# Patient Record
Sex: Female | Born: 1950 | Race: White | Hispanic: No | Marital: Married | Smoking: Former smoker
Health system: Southern US, Community
[De-identification: ages and names within clinical notes are randomized; demographics above are authoritative.]

## PROBLEM LIST (undated history)

## (undated) DIAGNOSIS — L409 Psoriasis, unspecified: Secondary | ICD-10-CM

## (undated) DIAGNOSIS — K219 Gastro-esophageal reflux disease without esophagitis: Secondary | ICD-10-CM

## (undated) DIAGNOSIS — E119 Type 2 diabetes mellitus without complications: Secondary | ICD-10-CM

## (undated) DIAGNOSIS — Z8669 Personal history of other diseases of the nervous system and sense organs: Secondary | ICD-10-CM

## (undated) DIAGNOSIS — I10 Essential (primary) hypertension: Secondary | ICD-10-CM

## (undated) DIAGNOSIS — C801 Malignant (primary) neoplasm, unspecified: Secondary | ICD-10-CM

## (undated) DIAGNOSIS — E042 Nontoxic multinodular goiter: Secondary | ICD-10-CM

## (undated) DIAGNOSIS — M1990 Unspecified osteoarthritis, unspecified site: Secondary | ICD-10-CM

## (undated) DIAGNOSIS — E7800 Pure hypercholesterolemia, unspecified: Secondary | ICD-10-CM

## (undated) DIAGNOSIS — G4730 Sleep apnea, unspecified: Secondary | ICD-10-CM

## (undated) DIAGNOSIS — J45909 Unspecified asthma, uncomplicated: Secondary | ICD-10-CM

## (undated) DIAGNOSIS — M4800 Spinal stenosis, site unspecified: Secondary | ICD-10-CM

## (undated) DIAGNOSIS — D649 Anemia, unspecified: Secondary | ICD-10-CM

## (undated) DIAGNOSIS — E785 Hyperlipidemia, unspecified: Secondary | ICD-10-CM

## (undated) HISTORY — PX: TOTAL HIP ARTHROPLASTY: SHX124

## (undated) HISTORY — PX: CHOLECYSTECTOMY: SHX55

## (undated) HISTORY — PX: JOINT REPLACEMENT: SHX530

## (undated) HISTORY — PX: CARPAL TUNNEL RELEASE: SHX101

---

## 1981-10-05 HISTORY — PX: CHOLECYSTECTOMY: SHX55

## 1997-10-05 DIAGNOSIS — C801 Malignant (primary) neoplasm, unspecified: Secondary | ICD-10-CM

## 1997-10-05 HISTORY — DX: Malignant (primary) neoplasm, unspecified: C80.1

## 2004-08-29 ENCOUNTER — Other Ambulatory Visit: Payer: Self-pay

## 2004-09-10 ENCOUNTER — Ambulatory Visit: Payer: Self-pay | Admitting: General Practice

## 2004-11-19 ENCOUNTER — Ambulatory Visit: Payer: Self-pay | Admitting: Family Medicine

## 2005-03-17 ENCOUNTER — Ambulatory Visit: Payer: Self-pay | Admitting: General Practice

## 2005-12-22 ENCOUNTER — Ambulatory Visit: Payer: Self-pay | Admitting: Family Medicine

## 2006-03-29 ENCOUNTER — Ambulatory Visit: Payer: Self-pay | Admitting: General Practice

## 2007-04-21 ENCOUNTER — Ambulatory Visit: Payer: Self-pay | Admitting: General Practice

## 2007-05-26 ENCOUNTER — Ambulatory Visit: Payer: Self-pay | Admitting: Anesthesiology

## 2007-06-29 ENCOUNTER — Ambulatory Visit: Payer: Self-pay | Admitting: Anesthesiology

## 2007-07-05 ENCOUNTER — Ambulatory Visit: Payer: Self-pay | Admitting: Family Medicine

## 2007-08-18 ENCOUNTER — Ambulatory Visit: Payer: Self-pay | Admitting: Anesthesiology

## 2007-09-21 ENCOUNTER — Ambulatory Visit: Payer: Self-pay | Admitting: Anesthesiology

## 2007-11-03 ENCOUNTER — Ambulatory Visit: Payer: Self-pay | Admitting: Anesthesiology

## 2007-11-14 ENCOUNTER — Ambulatory Visit: Payer: Self-pay | Admitting: Anesthesiology

## 2008-01-02 ENCOUNTER — Ambulatory Visit: Payer: Self-pay | Admitting: Anesthesiology

## 2008-07-02 ENCOUNTER — Ambulatory Visit: Payer: Self-pay | Admitting: Gastroenterology

## 2008-09-25 ENCOUNTER — Ambulatory Visit: Payer: Self-pay | Admitting: Family Medicine

## 2009-10-28 ENCOUNTER — Emergency Department: Payer: Self-pay | Admitting: Emergency Medicine

## 2009-11-22 ENCOUNTER — Ambulatory Visit: Payer: Self-pay | Admitting: Family Medicine

## 2010-11-27 ENCOUNTER — Ambulatory Visit: Payer: Self-pay | Admitting: Family Medicine

## 2012-01-07 ENCOUNTER — Ambulatory Visit: Payer: Self-pay | Admitting: Family Medicine

## 2012-01-11 ENCOUNTER — Ambulatory Visit: Payer: Self-pay | Admitting: General Practice

## 2013-02-08 ENCOUNTER — Ambulatory Visit: Payer: Self-pay | Admitting: Family Medicine

## 2013-04-17 ENCOUNTER — Ambulatory Visit: Payer: Self-pay | Admitting: General Practice

## 2013-04-17 LAB — CBC
HCT: 37 % (ref 35.0–47.0)
HGB: 11.4 g/dL — ABNORMAL LOW (ref 12.0–16.0)
MCHC: 30.8 g/dL — ABNORMAL LOW (ref 32.0–36.0)
MCV: 77 fL — ABNORMAL LOW (ref 80–100)
RBC: 4.78 10*6/uL (ref 3.80–5.20)
RDW: 17 % — ABNORMAL HIGH (ref 11.5–14.5)

## 2013-04-17 LAB — URINALYSIS, COMPLETE
Bilirubin,UR: NEGATIVE
Blood: NEGATIVE
Glucose,UR: NEGATIVE mg/dL (ref 0–75)
Ketone: NEGATIVE
Protein: NEGATIVE
Specific Gravity: 1.006 (ref 1.003–1.030)
Squamous Epithelial: 2
WBC UR: 2 /HPF (ref 0–5)

## 2013-04-17 LAB — BASIC METABOLIC PANEL
Anion Gap: 5 — ABNORMAL LOW (ref 7–16)
Calcium, Total: 9.5 mg/dL (ref 8.5–10.1)
Creatinine: 0.84 mg/dL (ref 0.60–1.30)
EGFR (African American): >60
Glucose: 97 mg/dL (ref 65–99)
Osmolality: 282 (ref 275–301)
Potassium: 3.7 mmol/L (ref 3.5–5.1)

## 2013-04-17 LAB — PROTIME-INR: INR: 0.9

## 2013-04-17 LAB — APTT: Activated PTT: 26.3 secs (ref 23.6–35.9)

## 2013-05-03 ENCOUNTER — Inpatient Hospital Stay: Payer: Self-pay | Admitting: General Practice

## 2013-05-03 LAB — HEMOGLOBIN: HGB: 11 g/dL — ABNORMAL LOW (ref 12.0–16.0)

## 2013-05-04 LAB — BASIC METABOLIC PANEL
Anion Gap: 4 — ABNORMAL LOW (ref 7–16)
Calcium, Total: 8.5 mg/dL (ref 8.5–10.1)
Chloride: 105 mmol/L (ref 98–107)
Creatinine: 0.97 mg/dL (ref 0.60–1.30)
EGFR (Non-African Amer.): >60
Glucose: 132 mg/dL — ABNORMAL HIGH (ref 65–99)
Potassium: 3.8 mmol/L (ref 3.5–5.1)
Sodium: 138 mmol/L (ref 136–145)

## 2013-05-04 LAB — HEMOGLOBIN: HGB: 8.6 g/dL — ABNORMAL LOW (ref 12.0–16.0)

## 2013-05-04 LAB — PLATELET COUNT: Platelet: 267 10*3/uL (ref 150–440)

## 2013-05-05 LAB — BASIC METABOLIC PANEL
BUN: 7 mg/dL (ref 7–18)
Calcium, Total: 8.7 mg/dL (ref 8.5–10.1)
Co2: 31 mmol/L (ref 21–32)
Potassium: 3.1 mmol/L — ABNORMAL LOW (ref 3.5–5.1)
Sodium: 137 mmol/L (ref 136–145)

## 2013-05-05 LAB — PLATELET COUNT: Platelet: 252 10*3/uL (ref 150–440)

## 2013-05-05 LAB — HEMOGLOBIN: HGB: 7.9 g/dL — ABNORMAL LOW (ref 12.0–16.0)

## 2013-05-06 LAB — BASIC METABOLIC PANEL
Calcium, Total: 8.8 mg/dL (ref 8.5–10.1)
Chloride: 98 mmol/L (ref 98–107)
Co2: 35 mmol/L — ABNORMAL HIGH (ref 21–32)
Creatinine: 0.86 mg/dL (ref 0.60–1.30)
EGFR (Non-African Amer.): >60
Glucose: 128 mg/dL — ABNORMAL HIGH (ref 65–99)
Osmolality: 277 (ref 275–301)
Potassium: 3.2 mmol/L — ABNORMAL LOW (ref 3.5–5.1)

## 2013-05-06 LAB — HEMOGLOBIN: HGB: 8.1 g/dL — ABNORMAL LOW (ref 12.0–16.0)

## 2013-06-19 ENCOUNTER — Ambulatory Visit: Payer: Self-pay | Admitting: Specialist

## 2013-09-20 ENCOUNTER — Ambulatory Visit: Payer: Self-pay | Admitting: Specialist

## 2014-02-09 ENCOUNTER — Ambulatory Visit: Payer: Self-pay | Admitting: Family Medicine

## 2014-03-22 ENCOUNTER — Ambulatory Visit: Payer: Self-pay | Admitting: Specialist

## 2014-03-29 DIAGNOSIS — G4733 Obstructive sleep apnea (adult) (pediatric): Secondary | ICD-10-CM | POA: Insufficient documentation

## 2014-03-29 DIAGNOSIS — R918 Other nonspecific abnormal finding of lung field: Secondary | ICD-10-CM | POA: Insufficient documentation

## 2014-03-29 DIAGNOSIS — E041 Nontoxic single thyroid nodule: Secondary | ICD-10-CM | POA: Insufficient documentation

## 2014-05-16 DIAGNOSIS — E119 Type 2 diabetes mellitus without complications: Secondary | ICD-10-CM | POA: Insufficient documentation

## 2014-05-16 DIAGNOSIS — I10 Essential (primary) hypertension: Secondary | ICD-10-CM | POA: Insufficient documentation

## 2014-05-16 DIAGNOSIS — J45909 Unspecified asthma, uncomplicated: Secondary | ICD-10-CM | POA: Insufficient documentation

## 2014-12-22 IMAGING — CT CT CHEST W/O CM
1 series · 15 of 33 positions shown, 19 images · non-contrast
Comparison: none

REASON FOR EXAM: rt lower lobe density
COMMENTS:

[Series 2: soft tissue · axial · 0.66mm/px · z∈[-384,-150]mm · 15 of 92 slices shown, 19 images]
[im 7/92  mediastinal]
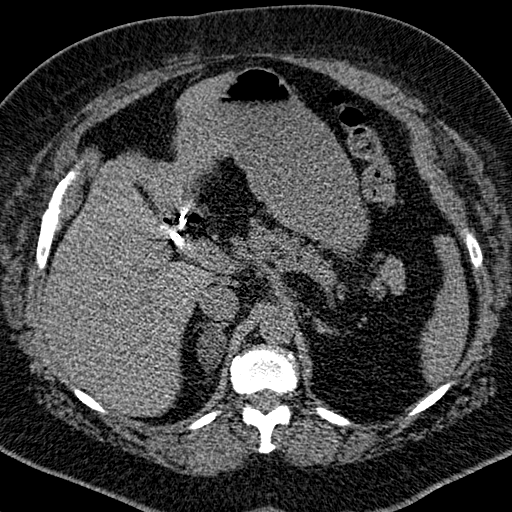
[im 7/92  lung]
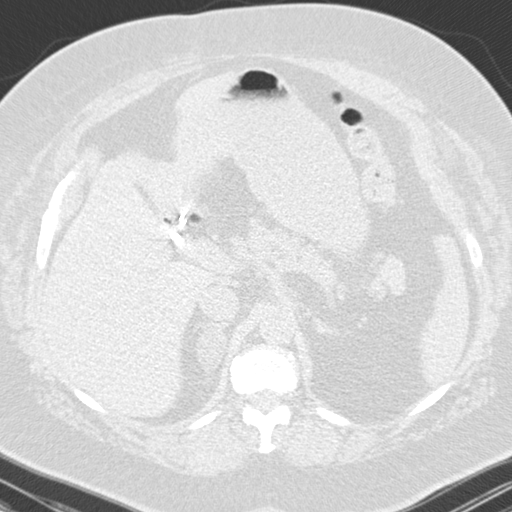
[im 14/92  lung]
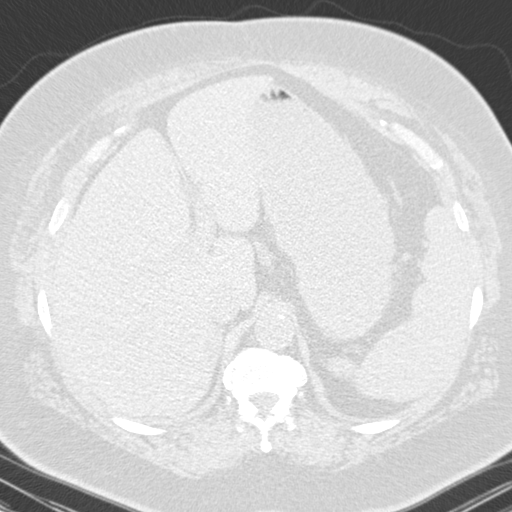
[im 19/92  lung]
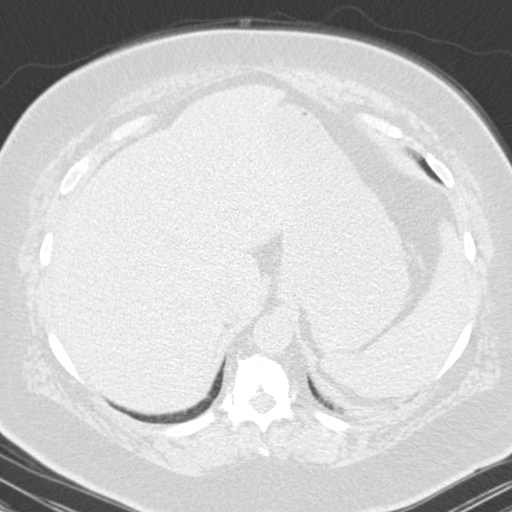
[im 24/92  lung]
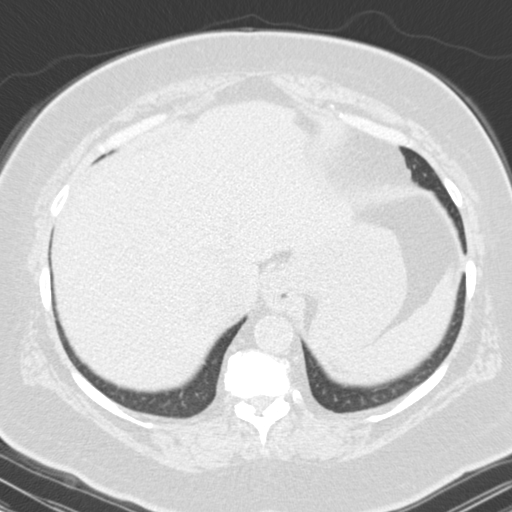
[im 31/92  mediastinal]
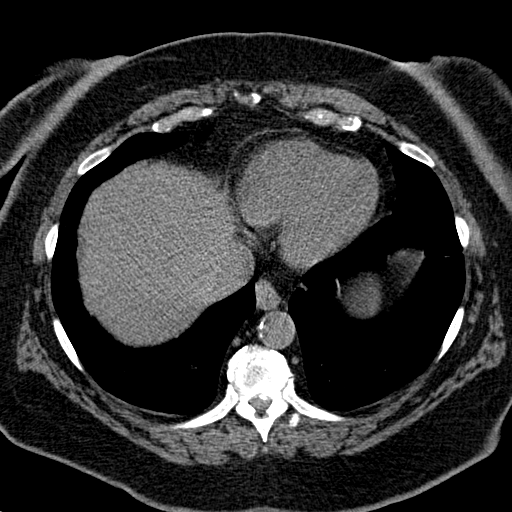
[im 31/92  lung]
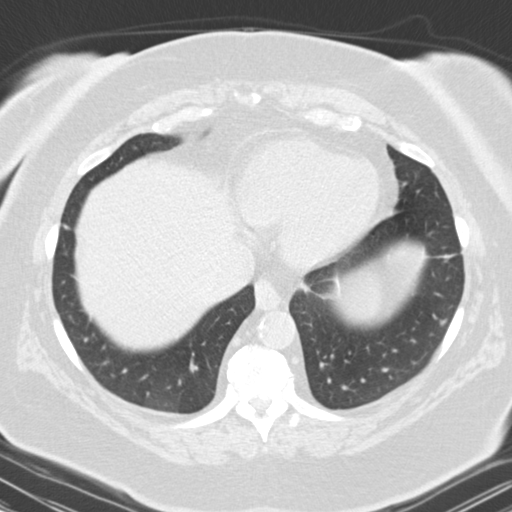
[im 37/92  lung]
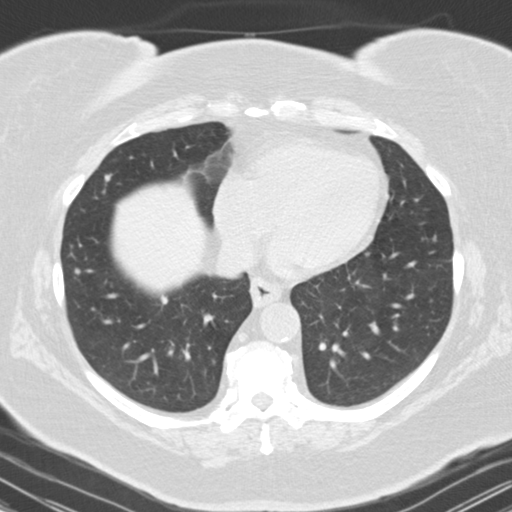
[im 41/92  lung]
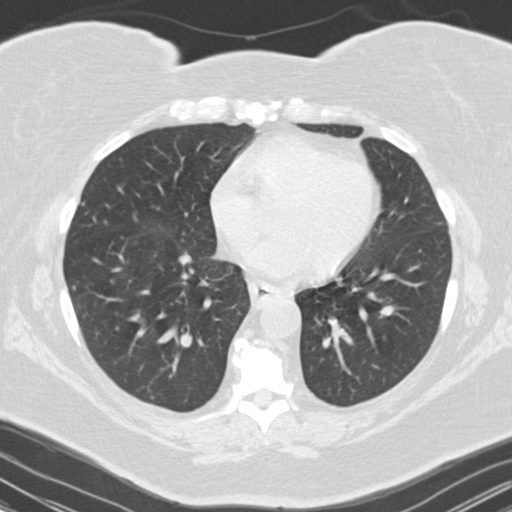
[im 48/92  lung]
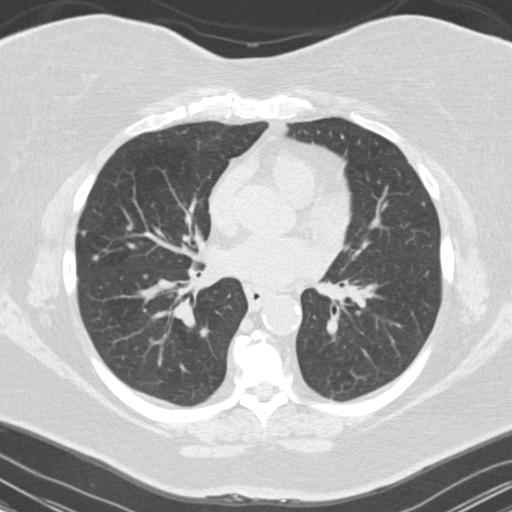
[im 51/92  mediastinal]
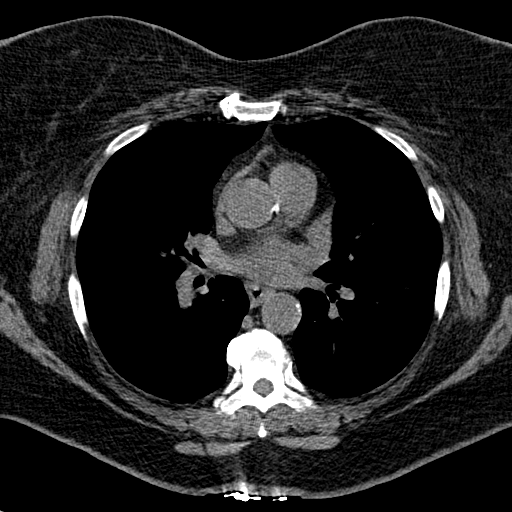
[im 51/92  lung]
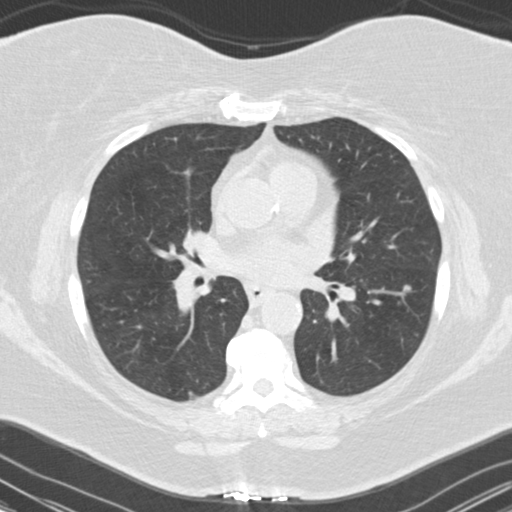
[im 55/92  lung]
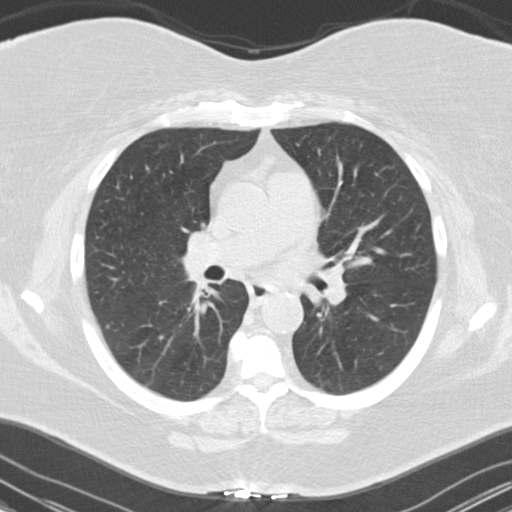
[im 61/92  lung]
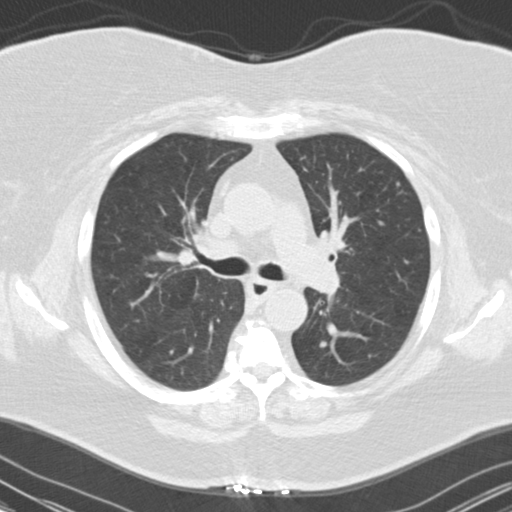
[im 68/92  lung]
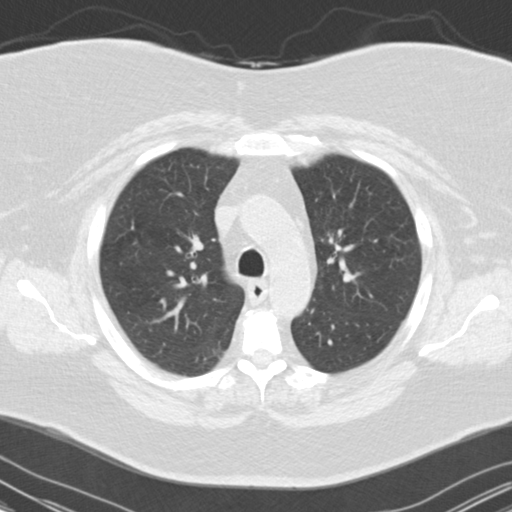
[im 73/92  mediastinal]
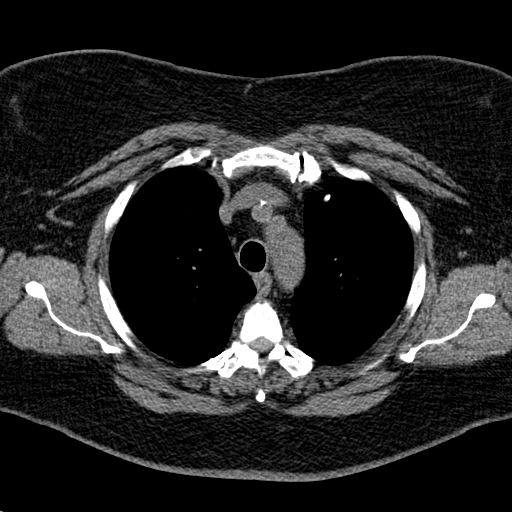
[im 73/92  lung]
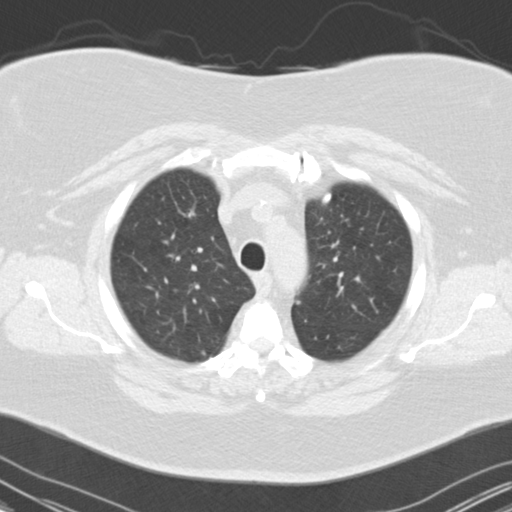
[im 78/92  lung]
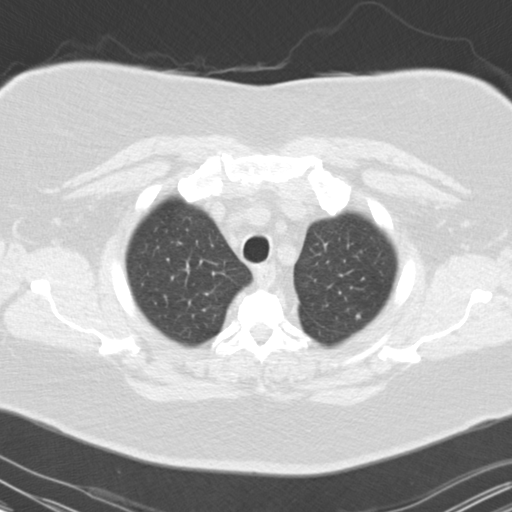
[im 85/92  lung]
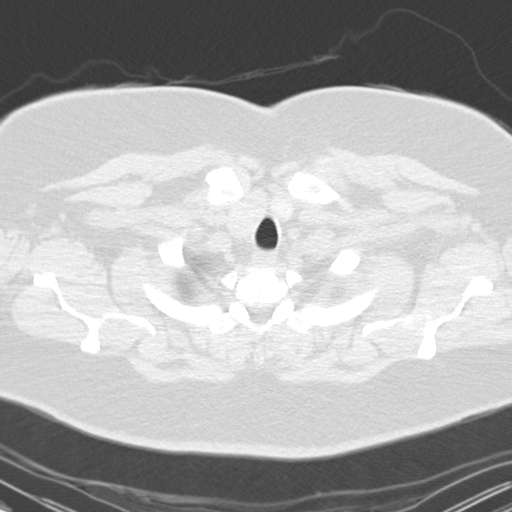

[15 of 33 positions shown; findings below may reference images not displayed]

PROCEDURE:     VELAZQUES - VELAZQUES CHEST WITHOUT CONTRAST  - June 19, 2013 [DATE]

RESULT:     Noncontrast CT of the chest is performed. The patient has no
previous similar study for comparison.

There is minimal nodularity in the left upper lobe suggestive of apical
fibrosis on images 10 and 11. There is a smoothly marginated noncalcified
nodular density on image 15 measuring approximately 2 mm diameter. There is
another nodular density measuring 3 mm on image 18 in the left upper lobe
without calcification. Lung window images show a multitude of additional 1
to 3 mm smoothly marginated nodular densities in both lungs without
calcification. These are nonspecific and certainly the possibility of
granulomatous disease or fibrosis chronic interstitial lung disease could be
considered. Some of the larger lesions may measure up is much as 4 mm to 5
mm. Nodule detection software suggests greater than 90 nodular densities
scattered throughout the lungs. There is linear subsegmental atelectasis or
fibrosis in both lower lobes. There is no effusion, edema or pneumothorax.
The possibility of metastatic disease is not excluded, especially if the
patient has a known primary malignancy. The bony structures appear to be
grossly normal. Heart is nonenlarged. There is no pleural or pericardial
effusion or pneumothorax. Cholecystectomy clips are present.
IMPRESSION: 1. Numerous nodular densities throughout both lungs some measuring up to 5
mm but most being in the 2 to 4 mm size range. Followup in 3 months is
recommended. If the patient has a known malignancy then the possibility of
widespread metastatic disease should be considered.

[REDACTED]

## 2015-01-18 ENCOUNTER — Other Ambulatory Visit: Payer: Self-pay | Admitting: Specialist

## 2015-01-18 DIAGNOSIS — J984 Other disorders of lung: Secondary | ICD-10-CM

## 2015-01-25 NOTE — Discharge Summary (Signed)
PATIENT NAME:  Amanda Watson, SPEICH MR#:  [id number] DATE OF BIRTH:  [birth year]  DATE OF ADMISSION:  05/03/2013 DATE OF DISCHARGE:    ADMITTING DIAGNOSIS: Degenerative arthrosis of the right hip.   DISCHARGE DIAGNOSIS:  DIAGNOSIS: Degenerative arthrosis of the right hip.  OPERATION: On 05/03/2013, the patient had a right total hip arthroplasty.   SURGEON:  Dr. Marry Guan.   ASSISTANT: April Berndt, NP.   ANESTHESIA: Spinal and general.  ESTIMATED BLOOD LOSS: 1200 mL.   DRAINS: Two medium drains to Hemovac reservoir.   IMPLANTS USED: DePuy 10.5 mm small stature AML femoral component, 50 mm outer diameter Pinnacle Gription sector acetabular component, 6.5 mm x 30 mm cancellus bone screw, +4 mm 10 degrees Pinnacle Marathon polyethylene liner and a 32 mm cobalt chrome hip ball with +1 mm neck length.   The patient was stabilized, brought to the recovery room, and then brought down to the orthopedic floor.   HISTORY AND PHYSICAL: The patient is a 64 year old female who presented for an upcoming total hip replacement. The patient has had increasing pain involving both hips with weight-bearing and the left seems to be more symptomatic. The patient and difficulty with activities of daily living and has been refractory to conservative treatment.   PHYSICAL EXAMINATION: GENERAL: Well-developed, well-nourished female with antalgic gait using a cane.  LUNGS: Clear to auscultation.  CARDIOVASCULAR:  Regular rate and rhythm. MUSCULOSKELETAL: With regard to the left hip, the patient has full extension at 100 degrees flexion with pain with motion. The patient has internal and external rotation of 10 degrees to 20 degrees. The patient has negative straight leg raise.   HOSPITAL COURSE: After initial admission on May 03, 2013, the patient was brought to the orthopedic floor. On postoperative day 1, the patient had a hemoglobin of 8.6. On postoperative day 2, it was down to 7.9 and the patient's potassium went  from 3.8 on postoperative day 1 to 3.1 on postoperative day 2. The patient was given potassium orally. The patient was also given no specific transfusion initially based on the fact that her vitals were stable and she was ambulating well with physical therapy with no complaints of dizziness. The patient did have nausea on postoperative day 2 and was given some IV nausea medicine and did well with this. The patient was ready to go home on 05/05/2013, with home health physical therapy.   CONDITION AT DISCHARGE: Stable.   DISPOSITION: The patient was sent home with home health physical therapy.   DISCHARGE INSTRUCTIONS:  The patient will follow up at [facility] in about 2 weeks with Dr. Vance Peper for staple removal. The patient will work this with physical therapy at home doing gait training and range of motion activities and strengthening. The patient will use posterior hip precautions. The patient is weight-bear as tolerated. The patient will use thigh-high TED hose on both legs, removed at bedtime. The patient will elevate her heels off the bed. The patient will be encouraged to do cough and deep breathing. The patient's diet is regular. The patient will keep her dressing clean and dry and try not to get it wet. The patient will call the clinic if there is any bright red bleeding, calf pain, bowel or bladder difficulty, or fever greater than 101.5.   DISCHARGE MEDICATIONS:  Resume home medications, and then to add oxycodone 5 mg 1 tablet q. 4 hours as needed for pain and Lovenox 40 mg subcutaneous once a day for 24 days and  discontinue.    ____________________________ Lenna Sciara. Reche Dixon, [place] [email or web address] D: 05/05/2013 07:32:02 ET T: 05/05/2013 07:54:44 ET JOB#: [id number]  cc: J. Reche Dixon, [place], <Dictator> J Charvis Lightner [facility] PA ELECTRONICALLY SIGNED 05/29/2013 10:35

## 2015-01-25 NOTE — Op Note (Signed)
PATIENT NAME:  Amanda Watson, Amanda Watson MR#:  [id number] DATE OF BIRTH:  07-18-1951  DATE OF PROCEDURE:  05/03/2013  PREOPERATIVE DIAGNOSIS: Degenerative arthrosis of the left hip.   POSTOPERATIVE DIAGNOSIS: Degenerative arthrosis of the left hip.   PROCEDURE PERFORMED: Left total hip arthroplasty.   SURGEON: Laurice Record. Holley Bouche., MD   ASSISTANT: April Berndt, NP (required to maintain retraction throughout the procedure)   ANESTHESIA: Spinal and general.   ESTIMATED BLOOD LOSS: 1200 mL.   IV FLUIDS: 3300 mL of crystalloid.   DRAINS: Two medium drains to Hemovac reservoir.   IMPLANTS UTILIZED: DePuy 10.5 mm small stature AML femoral component, a 50 mm outer diameter Pinnacle Gription Sector acetabular component, 6.5 mm x 30 mm cancellous bone screw, +4 mm 10-degree Pinnacle Marathon polyethylene liner and a 32 mm cobalt chrome hip ball with a +1 mm neck length.   INDICATIONS FOR SURGERY: The patient is a 64 year old female who has been seen for complaints of progressive left hip and groin pain. X-rays demonstrated severe degenerative changes. After discussion of the risks and benefits of surgical intervention, the patient expressed understanding of the risks and benefits and agreed with plans for surgical intervention.   PROCEDURE IN DETAIL: The patient was brought into the Operating Room, and after adequate spinal anesthesia was achieved the patient was placed in a right lateral decubitus position. An axillary roll was placed, and all bony prominences were well padded. The patient's left hip and leg were cleaned and prepped with alcohol and DuraPrep and draped in the usual sterile fashion. A "timeout" was performed as per usual protocol. A lateral curvilinear incision was made gently curving towards the posterior superior iliac spine. The IT band was incised in line with the skin incisions. Fibers of the gluteus maximus were split in line. The piriformis tendon was identified, skeletonized, and incised  at its insertion and the proximal femur reflected posteriorly. In a similar fashion, short external rotators were incised and reflected posteriorly. A T-type posterior capsulotomy was performed. Prior to dislocation of the femoral head, a threaded Steinmann pin was inserted through a separate stab incision into the pelvis superior to the acetabulum and then bent in the form of a stylus so as to assess limb length and hip offset throughout the procedure. The femoral head was then dislocated posteriorly. The gluteal sling was released so as to better mobilize the proximal femur. Inspection of the femoral head demonstrated severe degenerative changes. A femoral neck cut was performed using an oscillating saw. The anterior capsule was elevated off of the femoral neck. Inspection of the acetabulum demonstrated significant degenerative changes. A remnant of the labrum was excised. The acetabulum was reamed in a sequential fashion up to a 49 mm diameter. Good punctate bleeding bone was encountered. A 50 mm outer diameter Pinnacle 100 acetabular component was initially impacted into place with what was felt to be good fit. Trial polyethylene was inserted, and attention was directed to the proximal femur. A pilot hole for reaming of the proximal femoral canal was prepared using a high-speed bur. The proximal femoral canal was reamed in a sequential fashion up to a 10 mm diameter. This allowed for almost 7 degrees of scratch fit. The proximal femur was then prepared using a 10.5 mm aggressive side-biting reamer. A 10.5 mm small stature broach was inserted and impacted into place. The calcar region was planed accordingly, and trial reduction was performed with a 32 mm hip ball with a +1 mm neck length. Good equalization  of limb lengths was appreciated, and good hip offset was appreciated. The trial components were removed. A +4 mm 10-degree Pinnacle Marathon polyethylene liner was positioned and impacted into place. Next, a  10.5 mm small stature AML was initially positioned. Over 7 cm of scratch fit would be achieved. It was thus elected to remove the stem and ream line-to-line using a 10.5 mm reamer. The AML femoral component was again positioned and impacted into place. Excellent scratch fit was appreciated. Trial reduction was performed using the 32 mm hip ball with a +1 mm neck length. Excellent stability was noted anteriorly.  With trialing of the posterior stability, there was noted to be some movement of the cup. The femur was positioned so as to better visualize the acetabular component. There was indeed gross motion, and it was felt that soft tissue had apparently become trapped behind the acetabular component, thus not allowing for a secure fit. The Pinnacle 100 acetabular component was removed. Additional tension was made at debriding additional labrum as well as soft tissue along the anterior and inferior aspect of the acetabulum. The acetabulum was inspected and a rim felt to be intact. A 50 mm Pinnacle Gription Sector acetabular component was then carefully positioned, making sure to prevent any soft tissue from becoming trapped between the component and the bony acetabulum. The acetabular component was positioned and impacted into place. Excellent scratch fit was appreciated. It was elected to provide additional security by adding a 6.5 x 30 mm cancellous screw in the superior aspect of the cup. Again, excellent stability of the cup was appreciated. A new +4 mm 10-degree Pinnacle Marathon polyethylene implant was positioned with the high side at approximately 4:00 and then impacted into place. A secure fit of the liner was noted. A 32 mm cobalt chrome hip ball with a +1 mm neck length was placed on the cleaned trunnion and impacted into place. The hip was reduced and again placed through a range of motion. Excellent stability was noted both anteriorly and posteriorly. Good equalization of limb lengths and restoration of  hip offset was appreciated.   The wound was irrigated with copious amounts of normal saline with antibiotic solution using pulsatile lavage and then suctioned dry. Good hemostasis was noted. The posterior capsulotomy was repaired using #5 Ethibond. The piriformis tendon was reapproximated on the undersurface of the gluteus medius tendon using #5 Ethibond. The gluteal sling was repaired using #5 Ethibond. Two medium drains were placed within the wound bed and brought out through a separate stab incision to be attached to a Hemovac reservoir. IT band was repaired using interrupted sutures of #1 Vicryl. The subcutaneous tissue was approximated in layers using first #0 Vicryl followed by #2-0 Vicryl. Skin was closed with skin staples. A sterile dressing was applied.   The patient tolerated the procedure well. She was transported to the recovery room in stable condition.   ____________________________ Laurice Record. Holley Bouche., MD [email or web address] D: 05/03/2013 19:31:28 ET T: 05/03/2013 20:16:28 ET JOB#: [id number]  cc: Laurice Record. Holley Bouche., MD, <Dictator> JAMES P Holley Bouche MD ELECTRONICALLY SIGNED 05/05/2013 18:08

## 2015-01-29 ENCOUNTER — Other Ambulatory Visit: Payer: Self-pay | Admitting: Family Medicine

## 2015-01-29 DIAGNOSIS — Z1231 Encounter for screening mammogram for malignant neoplasm of breast: Secondary | ICD-10-CM

## 2015-02-12 ENCOUNTER — Ambulatory Visit
Admission: RE | Admit: 2015-02-12 | Discharge: 2015-02-12 | Disposition: A | Discharge status: Home / Self Care | Payer: Medicare Other | Source: Ambulatory Visit | Attending: Family Medicine | Admitting: Family Medicine

## 2015-02-12 DIAGNOSIS — Z1231 Encounter for screening mammogram for malignant neoplasm of breast: Secondary | ICD-10-CM | POA: Diagnosis not present

## 2015-02-12 HISTORY — DX: Malignant (primary) neoplasm, unspecified: C80.1

## 2015-03-15 ENCOUNTER — Ambulatory Visit
Admission: RE | Admit: 2015-03-15 | Discharge: 2015-03-15 | Disposition: A | Discharge status: Home / Self Care | Payer: Medicare Other | Source: Ambulatory Visit | Attending: Specialist | Admitting: Specialist

## 2015-03-15 DIAGNOSIS — E278 Other specified disorders of adrenal gland: Secondary | ICD-10-CM | POA: Insufficient documentation

## 2015-03-15 DIAGNOSIS — Z09 Encounter for follow-up examination after completed treatment for conditions other than malignant neoplasm: Secondary | ICD-10-CM | POA: Diagnosis present

## 2015-03-15 DIAGNOSIS — R911 Solitary pulmonary nodule: Secondary | ICD-10-CM | POA: Diagnosis present

## 2015-03-15 DIAGNOSIS — J984 Other disorders of lung: Secondary | ICD-10-CM

## 2015-03-25 ENCOUNTER — Other Ambulatory Visit: Payer: Self-pay | Admitting: Specialist

## 2015-03-25 DIAGNOSIS — R918 Other nonspecific abnormal finding of lung field: Secondary | ICD-10-CM

## 2015-06-12 ENCOUNTER — Encounter
Admission: RE | Admit: 2015-06-12 | Discharge: 2015-06-12 | Disposition: A | Discharge status: Home / Self Care | Payer: Medicare Other | Source: Ambulatory Visit | Attending: Surgery | Admitting: Surgery

## 2015-06-12 DIAGNOSIS — Z96642 Presence of left artificial hip joint: Secondary | ICD-10-CM | POA: Diagnosis not present

## 2015-06-12 DIAGNOSIS — Z7982 Long term (current) use of aspirin: Secondary | ICD-10-CM | POA: Diagnosis not present

## 2015-06-12 DIAGNOSIS — Z881 Allergy status to other antibiotic agents status: Secondary | ICD-10-CM | POA: Diagnosis not present

## 2015-06-12 DIAGNOSIS — Z7951 Long term (current) use of inhaled steroids: Secondary | ICD-10-CM | POA: Diagnosis not present

## 2015-06-12 DIAGNOSIS — Z87891 Personal history of nicotine dependence: Secondary | ICD-10-CM | POA: Diagnosis not present

## 2015-06-12 DIAGNOSIS — E119 Type 2 diabetes mellitus without complications: Secondary | ICD-10-CM | POA: Diagnosis not present

## 2015-06-12 DIAGNOSIS — K429 Umbilical hernia without obstruction or gangrene: Secondary | ICD-10-CM | POA: Diagnosis not present

## 2015-06-12 DIAGNOSIS — E049 Nontoxic goiter, unspecified: Secondary | ICD-10-CM | POA: Diagnosis not present

## 2015-06-12 DIAGNOSIS — G4710 Hypersomnia, unspecified: Secondary | ICD-10-CM | POA: Diagnosis not present

## 2015-06-12 DIAGNOSIS — K219 Gastro-esophageal reflux disease without esophagitis: Secondary | ICD-10-CM | POA: Diagnosis not present

## 2015-06-12 DIAGNOSIS — L409 Psoriasis, unspecified: Secondary | ICD-10-CM | POA: Diagnosis not present

## 2015-06-12 DIAGNOSIS — Z9049 Acquired absence of other specified parts of digestive tract: Secondary | ICD-10-CM | POA: Diagnosis not present

## 2015-06-12 DIAGNOSIS — J45909 Unspecified asthma, uncomplicated: Secondary | ICD-10-CM | POA: Diagnosis not present

## 2015-06-12 DIAGNOSIS — Z79899 Other long term (current) drug therapy: Secondary | ICD-10-CM | POA: Diagnosis not present

## 2015-06-12 DIAGNOSIS — Z9889 Other specified postprocedural states: Secondary | ICD-10-CM | POA: Diagnosis not present

## 2015-06-12 DIAGNOSIS — E780 Pure hypercholesterolemia: Secondary | ICD-10-CM | POA: Diagnosis not present

## 2015-06-12 DIAGNOSIS — E785 Hyperlipidemia, unspecified: Secondary | ICD-10-CM | POA: Diagnosis not present

## 2015-06-12 DIAGNOSIS — G4730 Sleep apnea, unspecified: Secondary | ICD-10-CM | POA: Diagnosis not present

## 2015-06-12 DIAGNOSIS — Z8249 Family history of ischemic heart disease and other diseases of the circulatory system: Secondary | ICD-10-CM | POA: Diagnosis not present

## 2015-06-12 DIAGNOSIS — I10 Essential (primary) hypertension: Secondary | ICD-10-CM | POA: Diagnosis not present

## 2015-06-12 HISTORY — DX: Anemia, unspecified: D64.9

## 2015-06-12 HISTORY — DX: Spinal stenosis, site unspecified: M48.00

## 2015-06-12 HISTORY — DX: Gastro-esophageal reflux disease without esophagitis: K21.9

## 2015-06-12 HISTORY — DX: Personal history of other diseases of the nervous system and sense organs: Z86.69

## 2015-06-12 HISTORY — DX: Hyperlipidemia, unspecified: E78.5

## 2015-06-12 HISTORY — DX: Nontoxic multinodular goiter: E04.2

## 2015-06-12 HISTORY — DX: Essential (primary) hypertension: I10

## 2015-06-12 HISTORY — DX: Pure hypercholesterolemia, unspecified: E78.00

## 2015-06-12 HISTORY — DX: Sleep apnea, unspecified: G47.30

## 2015-06-12 HISTORY — DX: Unspecified asthma, uncomplicated: J45.909

## 2015-06-12 HISTORY — DX: Psoriasis, unspecified: L40.9

## 2015-06-12 HISTORY — DX: Type 2 diabetes mellitus without complications: E11.9

## 2015-06-12 NOTE — OR Nursing (Signed)
Spoke with Dr Rosey Bath regarding today's EKG. 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 OK to proceed.

## 2015-06-12 NOTE — Patient Instructions (Signed)
  Your procedure is scheduled on: Friday 06/14/2015 Report to Day Surgery. 2ND FLOOR MEDICAL MALL ENTRANCE To find out your arrival time please call [phone] between 1PM - 3PM on Thursday 06/13/2015.  Remember: Instructions that are not followed completely may result in serious medical risk, up to and including death, or upon the discretion of your surgeon and anesthesiologist your surgery may need to be rescheduled.    __X__ 1. Do not eat food or drink liquids after midnight. No gum chewing or hard candies.     __X__ 2. No Alcohol for 24 hours before or after surgery.   ____ 3. Bring all medications with you on the day of surgery if instructed.    __X__ 4. Notify your doctor if there is any change in your medical condition     (cold, fever, infections).     Do not wear jewelry, make-up, hairpins, clips or nail polish.  Do not wear lotions, powders, or perfumes.   Do not shave 48 hours prior to surgery. Men may shave face and neck.  Do not bring valuables to the hospital.    [facility] is not responsible for any belongings or valuables.               Contacts, dentures or bridgework may not be worn into surgery.  Leave your suitcase in the car. After surgery it may be brought to your room.  For patients admitted to the hospital, discharge time is determined by your                treatment team.   Patients discharged the day of surgery will not be allowed to drive home.   Please read over the following fact sheets that you were given:   Surgical Site Infection Prevention   __X__ Take these medicines the morning of surgery with A SIP OF WATER:    1. RANITIDINE  2.   3.   4.  5.  6.  ____ Fleet Enema (as directed)   __X__ Use CHG Soap as directed  __X__ Use inhalers on the day of surgery  BOTH ADVAIR AND ALBUTEROL  __X__ Stop metformin 2 days prior to surgery STOP TODAY  ____ Take 1/2 of usual insulin dose the night before surgery and none on the morning of surgery.    __X__ Stop Coumadin/Plavix/aspirin on AS DIRECTED  ____ Stop Anti-inflammatories on    __X__ Stop supplements until after surgery.  FISH OIL, ESTROBLEND  __X__ Bring C-Pap to the hospital.

## 2015-06-14 ENCOUNTER — Ambulatory Visit: Payer: Medicare Other | Admitting: Anesthesiology

## 2015-06-14 ENCOUNTER — Ambulatory Visit
Admission: RE | Admit: 2015-06-14 | Discharge: 2015-06-14 | Disposition: A | Discharge status: Home / Self Care | Payer: Medicare Other | Source: Ambulatory Visit | Attending: Surgery | Admitting: Surgery

## 2015-06-14 ENCOUNTER — Encounter
Admission: RE | Disposition: A | Discharge status: Home / Self Care | Payer: Self-pay | Source: Ambulatory Visit | Attending: Surgery

## 2015-06-14 ENCOUNTER — Encounter: Payer: Self-pay | Admitting: Anesthesiology

## 2015-06-14 DIAGNOSIS — E119 Type 2 diabetes mellitus without complications: Secondary | ICD-10-CM | POA: Insufficient documentation

## 2015-06-14 DIAGNOSIS — Z8249 Family history of ischemic heart disease and other diseases of the circulatory system: Secondary | ICD-10-CM | POA: Insufficient documentation

## 2015-06-14 DIAGNOSIS — Z9889 Other specified postprocedural states: Secondary | ICD-10-CM | POA: Insufficient documentation

## 2015-06-14 DIAGNOSIS — Z9049 Acquired absence of other specified parts of digestive tract: Secondary | ICD-10-CM | POA: Insufficient documentation

## 2015-06-14 DIAGNOSIS — Z96642 Presence of left artificial hip joint: Secondary | ICD-10-CM | POA: Insufficient documentation

## 2015-06-14 DIAGNOSIS — K429 Umbilical hernia without obstruction or gangrene: Secondary | ICD-10-CM | POA: Diagnosis not present

## 2015-06-14 DIAGNOSIS — Z881 Allergy status to other antibiotic agents status: Secondary | ICD-10-CM | POA: Insufficient documentation

## 2015-06-14 DIAGNOSIS — Z79899 Other long term (current) drug therapy: Secondary | ICD-10-CM | POA: Insufficient documentation

## 2015-06-14 DIAGNOSIS — L409 Psoriasis, unspecified: Secondary | ICD-10-CM | POA: Insufficient documentation

## 2015-06-14 DIAGNOSIS — Z7951 Long term (current) use of inhaled steroids: Secondary | ICD-10-CM | POA: Insufficient documentation

## 2015-06-14 DIAGNOSIS — K219 Gastro-esophageal reflux disease without esophagitis: Secondary | ICD-10-CM | POA: Insufficient documentation

## 2015-06-14 DIAGNOSIS — J45909 Unspecified asthma, uncomplicated: Secondary | ICD-10-CM | POA: Insufficient documentation

## 2015-06-14 DIAGNOSIS — G4730 Sleep apnea, unspecified: Secondary | ICD-10-CM | POA: Insufficient documentation

## 2015-06-14 DIAGNOSIS — E785 Hyperlipidemia, unspecified: Secondary | ICD-10-CM | POA: Insufficient documentation

## 2015-06-14 DIAGNOSIS — G4710 Hypersomnia, unspecified: Secondary | ICD-10-CM | POA: Insufficient documentation

## 2015-06-14 DIAGNOSIS — I10 Essential (primary) hypertension: Secondary | ICD-10-CM | POA: Insufficient documentation

## 2015-06-14 DIAGNOSIS — E049 Nontoxic goiter, unspecified: Secondary | ICD-10-CM | POA: Insufficient documentation

## 2015-06-14 DIAGNOSIS — Z7982 Long term (current) use of aspirin: Secondary | ICD-10-CM | POA: Insufficient documentation

## 2015-06-14 DIAGNOSIS — Z87891 Personal history of nicotine dependence: Secondary | ICD-10-CM | POA: Insufficient documentation

## 2015-06-14 DIAGNOSIS — E780 Pure hypercholesterolemia: Secondary | ICD-10-CM | POA: Insufficient documentation

## 2015-06-14 HISTORY — PX: UMBILICAL HERNIA REPAIR: SHX196

## 2015-06-14 LAB — GLUCOSE, CAPILLARY
GLUCOSE-CAPILLARY: 121 mg/dL — AB (ref 65–99)
Glucose-Capillary: 105 mg/dL — ABNORMAL HIGH (ref 65–99)

## 2015-06-14 SURGERY — REPAIR, HERNIA, UMBILICAL, ADULT
Anesthesia: General

## 2015-06-14 MED ORDER — CEFAZOLIN SODIUM-DEXTROSE 2-3 GM-% IV SOLR
INTRAVENOUS | Status: AC
  Administered 2015-06-14: 2 g via INTRAVENOUS
  Filled 2015-06-14: qty 50

## 2015-06-14 MED ORDER — KETOROLAC TROMETHAMINE 30 MG/ML IJ SOLN
INTRAMUSCULAR | Status: DC | PRN
  Administered 2015-06-14: 30 mg via INTRAVENOUS

## 2015-06-14 MED ORDER — CEFAZOLIN SODIUM-DEXTROSE 2-3 GM-% IV SOLR: 2.0000 g | Freq: Once | INTRAVENOUS | Status: DC

## 2015-06-14 MED ORDER — HYDROCODONE-ACETAMINOPHEN 5-325 MG PO TABS: 1.0000 | ORAL_TABLET | ORAL | Status: DC | PRN

## 2015-06-14 MED ORDER — BUPIVACAINE-EPINEPHRINE (PF) 0.5% -1:200000 IJ SOLN
INTRAMUSCULAR | Status: AC
  Filled 2015-06-14: qty 30

## 2015-06-14 MED ORDER — DEXAMETHASONE SODIUM PHOSPHATE 4 MG/ML IJ SOLN
INTRAMUSCULAR | Status: DC | PRN
  Administered 2015-06-14: 4 mg via INTRAVENOUS

## 2015-06-14 MED ORDER — LIDOCAINE HCL (CARDIAC) 20 MG/ML IV SOLN
INTRAVENOUS | Status: DC | PRN
  Administered 2015-06-14: 60 mg via INTRAVENOUS

## 2015-06-14 MED ORDER — SODIUM CHLORIDE 0.9 % IV SOLN
INTRAVENOUS | Status: DC
  Administered 2015-06-14 (×2): via INTRAVENOUS

## 2015-06-14 MED ORDER — FENTANYL CITRATE (PF) 100 MCG/2ML IJ SOLN
25.0000 ug | INTRAMUSCULAR | Status: DC | PRN
  Administered 2015-06-14 (×4): 25 ug via INTRAVENOUS

## 2015-06-14 MED ORDER — ROCURONIUM BROMIDE 100 MG/10ML IV SOLN
INTRAVENOUS | Status: DC | PRN
  Administered 2015-06-14: 40 mg via INTRAVENOUS

## 2015-06-14 MED ORDER — FENTANYL CITRATE (PF) 100 MCG/2ML IJ SOLN
INTRAMUSCULAR | Status: AC
  Filled 2015-06-14: qty 2

## 2015-06-14 MED ORDER — PROPOFOL 10 MG/ML IV BOLUS
INTRAVENOUS | Status: DC | PRN
  Administered 2015-06-14: 150 mg via INTRAVENOUS

## 2015-06-14 MED ORDER — BUPIVACAINE-EPINEPHRINE 0.5% -1:200000 IJ SOLN
INTRAMUSCULAR | Status: DC | PRN
  Administered 2015-06-14: 10 mL

## 2015-06-14 MED ORDER — NEOSTIGMINE METHYLSULFATE 10 MG/10ML IV SOLN
INTRAVENOUS | Status: DC | PRN
Start: 2015-06-14 — End: 2015-06-14
  Administered 2015-06-14: 4 mg via INTRAVENOUS

## 2015-06-14 MED ORDER — MIDAZOLAM HCL 2 MG/2ML IJ SOLN
INTRAMUSCULAR | Status: DC | PRN
  Administered 2015-06-14: 2 mg via INTRAVENOUS

## 2015-06-14 MED ORDER — ONDANSETRON HCL 4 MG/2ML IJ SOLN
INTRAMUSCULAR | Status: DC | PRN
  Administered 2015-06-14: 4 mg via INTRAVENOUS

## 2015-06-14 MED ORDER — ONDANSETRON HCL 4 MG/2ML IJ SOLN: 4.0000 mg | Freq: Once | INTRAMUSCULAR | Status: DC | PRN

## 2015-06-14 MED ORDER — GLYCOPYRROLATE 0.2 MG/ML IJ SOLN
INTRAMUSCULAR | Status: DC | PRN
  Administered 2015-06-14: .6 mg via INTRAVENOUS

## 2015-06-14 SURGICAL SUPPLY — 24 items
BARD SOFT MESH IMPLANT
BLADE SURG 15 STRL LF DISP TIS (BLADE) ×1 IMPLANT
BLADE SURG 15 STRL SS (BLADE) ×2
CANISTER SUCT 1200ML W/VALVE (MISCELLANEOUS) ×3 IMPLANT
CHLORAPREP W/TINT 26ML (MISCELLANEOUS) ×3 IMPLANT
DRAPE PED LAPAROTOMY (DRAPES) ×3 IMPLANT
GLOVE BIO SURGEON STRL SZ7.5 (GLOVE) ×3 IMPLANT
GOWN STRL REUS W/ TWL LRG LVL3 (GOWN DISPOSABLE) ×3 IMPLANT
GOWN STRL REUS W/TWL LRG LVL3 (GOWN DISPOSABLE) ×6
KIT RM TURNOVER STRD PROC AR (KITS) ×3 IMPLANT
LABEL OR SOLS (LABEL) IMPLANT
LIQUID BAND (GAUZE/BANDAGES/DRESSINGS) ×3 IMPLANT
MESH SYNTHETIC 4X6 SOFT BARD (Mesh General) ×1 IMPLANT
MESH SYNTHETIC SOFT BARD 4X6 (Mesh General) ×2 IMPLANT
NEEDLE HYPO 25X1 1.5 SAFETY (NEEDLE) ×3 IMPLANT
NS IRRIG 500ML POUR BTL (IV SOLUTION) ×3 IMPLANT
PACK BASIN MINOR ARMC (MISCELLANEOUS) ×3 IMPLANT
PAD GROUND ADULT SPLIT (MISCELLANEOUS) ×3 IMPLANT
SUT CHROMIC 3 0 SH 27 (SUTURE) ×3 IMPLANT
SUT CHROMIC 4 0 RB 1X27 (SUTURE) IMPLANT
SUT MNCRL+ 5-0 UNDYED PC-3 (SUTURE) ×1 IMPLANT
SUT MONOCRYL 5-0 (SUTURE) ×2
SUT SURGILON 0 30 BLK (SUTURE) ×6 IMPLANT
SYRINGE 10CC LL (SYRINGE) ×3 IMPLANT

## 2015-06-14 NOTE — Anesthesia Preprocedure Evaluation (Signed)
Anesthesia Evaluation  Patient identified by MRN, date of birth, ID band Patient awake    Reviewed: Allergy & Precautions, H&P , NPO status , Patient's Chart, lab work & pertinent test results, reviewed documented beta blocker date and time   History of Anesthesia Complications Negative for: history of anesthetic complications  Airway Mallampati: III  TM Distance: >3 FB Neck ROM: full    Dental no notable dental hx. (+) Teeth Intact   Pulmonary neg shortness of breath, asthma , sleep apnea and Continuous Positive Airway Pressure Ventilation , neg COPD, neg recent URI, former smoker,    Pulmonary exam normal breath sounds clear to auscultation       Cardiovascular Exercise Tolerance: Good hypertension, On Medications (-) angina(-) CAD, (-) Past MI, (-) Cardiac Stents and (-) CABG Normal cardiovascular exam(-) dysrhythmias (-) Valvular Problems/Murmurs Rhythm:regular Rate:Normal     Neuro/Psych Lumbar spinal stenosis negative neurological ROS  negative psych ROS   GI/Hepatic Neg liver ROS, GERD  Medicated and Controlled,  Endo/Other  diabetes, Oral Hypoglycemic AgentsMorbid obesity  Renal/GU negative Renal ROS  negative genitourinary   Musculoskeletal   Abdominal   Peds  Hematology  (+) Blood dyscrasia, anemia ,   Anesthesia Other Findings Past Medical History:   Cancer                                          1999           Comment:cervical ca   Asthma                                                       Diabetes mellitus without complication                       Hypertension                                                 Hypercholesteremia                                           GERD (gastroesophageal reflux disease)                       Sleep apnea                                                  Psoriasis                                                    Spinal stenosis  History of Bell's palsy                                      Anemia                                                       Hyperlipidemia                                               Multinodular goiter                                          Reproductive/Obstetrics negative OB ROS                             Anesthesia Physical Anesthesia Plan  ASA: III  Anesthesia Plan: General   Post-op Pain Management:    Induction:   Airway Management Planned:   Additional Equipment:   Intra-op Plan:   Post-operative Plan:   Informed Consent: I have reviewed the patients History and Physical, chart, labs and discussed the procedure including the risks, benefits and alternatives for the proposed anesthesia with the patient or authorized representative who has indicated his/her understanding and acceptance.   Dental Advisory Given  Plan Discussed with: Anesthesiologist, CRNA and Surgeon  Anesthesia Plan Comments:         Anesthesia Quick Evaluation

## 2015-06-14 NOTE — Progress Notes (Signed)
She reports no change in overall condition since the day of the office visit.  The umbilical hernia was demonstrated on physical exam.  I discussed the plan for surgery.

## 2015-06-14 NOTE — Progress Notes (Signed)
Kefzol given IVPB at 1755.  Was unable to enter order at that time.

## 2015-06-14 NOTE — Op Note (Signed)
OPERATIVE REPORT  PREOPERATIVE  DIAGNOSIS: . Umbilical hernia  POSTOPERATIVE DIAGNOSIS: Umbilical hernia.  PROCEDURE: . Umbilical hernia repair  ANESTHESIA:  General  SURGEON: Amanda Brome  MD   INDICATIONS: .Marland Kitchen She has developed bulging at the umbilicus. An umbilical hernia was demonstrated on physical exam. This appeared to be large enough to admit small bowel. Repair is recommended for definitive treatment.  With the patient on the operating table in the supine position under general anesthesia the abdomen was prepared with ChloraPrep and draped in a sterile manner.  A transversely oriented supraumbilical curvilinear incision was made some 4 cm in length and carried down through subcutaneous tissues. An umbilical hernia sac was dissected free from the skin of the umbilicus and dissected free from the fascial ring defect. The sac was some 5 cm in length. The sac was inverted back into the peritoneal cavity. The fascial ring defect was approximately 2 cm in dimension. Bard soft mesh was cut to create an oval shape of some 3 x 4 cm and was placed into the properitoneal plane and sutured to the overlying fascia with through and through 0 Surgilon sutures. The repair was carried out with a transversely oriented suture line of interrupted 0 Surgilon figure-of-eight sutures incorporating each suture into the mesh. The deep fascia and subcutaneous tissues were infiltrated with half percent Sensorcaine with epinephrine. The skin of the umbilicus was sutured to the deep fascia with 4-0 Monocryl. Next the skin was closed with running 4-0 Monocryl subcuticular suture and LiquiBand. The patient appeared to tolerate the procedure satisfactorily and was prepared for transfer to the recovery room  [facility].D.

## 2015-06-14 NOTE — Anesthesia Procedure Notes (Signed)
Procedure Name: Intubation Date/Time: 06/14/2015 6:24 PM Performed by: Sinda Du Pre-anesthesia Checklist: Patient identified, Patient being monitored, Timeout performed, Emergency Drugs available and Suction available Patient Re-evaluated:Patient Re-evaluated prior to inductionOxygen Delivery Method: Circle system utilized Preoxygenation: Pre-oxygenation with 100% oxygen Intubation Type: IV induction Ventilation: Mask ventilation without difficulty Laryngoscope Size: Mac and 3 Grade View: Grade III Tube type: Oral Tube size: 7.5 mm Number of attempts: 1 Airway Equipment and Method: Stylet Placement Confirmation: ETT inserted through vocal cords under direct vision,  positive ETCO2 and breath sounds checked- equal and bilateral Secured at: 21 cm Tube secured with: Tape Dental Injury: Teeth and Oropharynx as per pre-operative assessment

## 2015-06-14 NOTE — Transfer of Care (Signed)
Immediate Anesthesia Transfer of Care Note  Patient: Amanda Watson  Procedure(s) Performed: Procedure(s): HERNIA REPAIR UMBILICAL ADULT (N/A)  Patient Location: PACU  Anesthesia Type:General  Level of Consciousness: awake, alert  and oriented  Airway & Oxygen Therapy: Patient Spontanous Breathing and Patient connected to face mask oxygen  Post-op Assessment: Report given to RN  Post vital signs: stable  Last Vitals:  Filed Vitals:   06/14/15 1454  BP: 100/47  Pulse: 81  Temp: 37.2 C  Resp: 18    Complications: No apparent anesthesia complications

## 2015-06-14 NOTE — Discharge Instructions (Signed)
Take Tylenol or Norco if needed for pain. Resume aspirin on Monday. May shower. Avoid straining and heavy lifting.AMBULATORY SURGERY  DISCHARGE INSTRUCTIONS   1) The drugs that you were given will stay in your system until tomorrow so for the next 24 hours you should not:  A) Drive an automobile B) Make any legal decisions C) Drink any alcoholic beverage   2) You may resume regular meals tomorrow.  Today it is better to start with liquids and gradually work up to solid foods.  You may eat anything you prefer, but it is better to start with liquids, then soup and crackers, and gradually work up to solid foods.   3) Please notify your doctor immediately if you have any unusual bleeding, trouble breathing, redness and pain at the surgery site, drainage, fever, or pain not relieved by medication.    4) Additional Instructions:        Please contact your physician with any problems or Same Day Surgery at [phone], Monday through Friday 6 am to 4 pm, or [place] at [facility] number at [phone].

## 2015-06-17 ENCOUNTER — Encounter: Payer: Self-pay | Admitting: Surgery

## 2015-06-18 NOTE — Anesthesia Postprocedure Evaluation (Signed)
  Anesthesia Post-op Note  Patient: Amanda Watson  Procedure(s) Performed: Procedure(s): HERNIA REPAIR UMBILICAL ADULT (N/A)  Anesthesia type:General  Patient location: PACU  Post pain: Pain level controlled  Post assessment: Post-op Vital signs reviewed, Patient's Cardiovascular Status Stable, Respiratory Function Stable, Patent Airway and No signs of Nausea or vomiting  Post vital signs: Reviewed and stable  Last Vitals:  Filed Vitals:   06/14/15 2055  BP: 97/53  Pulse: 71  Temp: 36.1 C  Resp: 19    Level of consciousness: awake, alert  and patient cooperative  Complications: No apparent anesthesia complications

## 2015-10-15 ENCOUNTER — Ambulatory Visit: Payer: BC Managed Care – PPO

## 2015-10-18 ENCOUNTER — Ambulatory Visit
Admission: RE | Admit: 2015-10-18 | Discharge: 2015-10-18 | Disposition: A | Discharge status: Home / Self Care | Payer: Medicare Other | Source: Ambulatory Visit | Attending: Specialist | Admitting: Specialist

## 2015-10-18 DIAGNOSIS — N281 Cyst of kidney, acquired: Secondary | ICD-10-CM | POA: Insufficient documentation

## 2015-10-18 DIAGNOSIS — R918 Other nonspecific abnormal finding of lung field: Secondary | ICD-10-CM | POA: Insufficient documentation

## 2015-10-18 DIAGNOSIS — D3501 Benign neoplasm of right adrenal gland: Secondary | ICD-10-CM | POA: Insufficient documentation

## 2015-10-18 DIAGNOSIS — K760 Fatty (change of) liver, not elsewhere classified: Secondary | ICD-10-CM | POA: Diagnosis not present

## 2015-11-29 DIAGNOSIS — D649 Anemia, unspecified: Secondary | ICD-10-CM | POA: Insufficient documentation

## 2015-11-29 DIAGNOSIS — E785 Hyperlipidemia, unspecified: Secondary | ICD-10-CM | POA: Insufficient documentation

## 2016-01-22 ENCOUNTER — Other Ambulatory Visit: Payer: Self-pay | Admitting: Family Medicine

## 2016-01-22 DIAGNOSIS — Z1231 Encounter for screening mammogram for malignant neoplasm of breast: Secondary | ICD-10-CM

## 2016-02-13 ENCOUNTER — Ambulatory Visit
Admission: RE | Admit: 2016-02-13 | Discharge: 2016-02-13 | Disposition: A | Discharge status: Home / Self Care | Payer: Medicare Other | Source: Ambulatory Visit | Attending: Family Medicine | Admitting: Family Medicine

## 2016-02-13 DIAGNOSIS — Z1231 Encounter for screening mammogram for malignant neoplasm of breast: Secondary | ICD-10-CM | POA: Diagnosis present

## 2016-02-17 ENCOUNTER — Other Ambulatory Visit: Payer: Self-pay | Admitting: Family Medicine

## 2016-02-17 DIAGNOSIS — R928 Other abnormal and inconclusive findings on diagnostic imaging of breast: Secondary | ICD-10-CM

## 2016-03-06 ENCOUNTER — Ambulatory Visit
Admission: RE | Admit: 2016-03-06 | Discharge: 2016-03-06 | Disposition: A | Discharge status: Home / Self Care | Payer: Medicare Other | Source: Ambulatory Visit | Attending: Family Medicine | Admitting: Family Medicine

## 2016-03-06 DIAGNOSIS — R928 Other abnormal and inconclusive findings on diagnostic imaging of breast: Secondary | ICD-10-CM

## 2016-03-06 DIAGNOSIS — N6489 Other specified disorders of breast: Secondary | ICD-10-CM | POA: Insufficient documentation

## 2016-06-17 ENCOUNTER — Ambulatory Visit: Payer: Medicare Other | Attending: Specialist

## 2016-06-17 DIAGNOSIS — G4733 Obstructive sleep apnea (adult) (pediatric): Secondary | ICD-10-CM | POA: Diagnosis not present

## 2016-06-30 ENCOUNTER — Ambulatory Visit: Payer: Medicare Other | Attending: Specialist

## 2016-06-30 DIAGNOSIS — G4733 Obstructive sleep apnea (adult) (pediatric): Secondary | ICD-10-CM | POA: Diagnosis not present

## 2016-10-05 HISTORY — PX: EYE SURGERY: SHX253

## 2017-04-08 ENCOUNTER — Other Ambulatory Visit: Payer: Self-pay | Admitting: Family Medicine

## 2017-04-08 DIAGNOSIS — Z1231 Encounter for screening mammogram for malignant neoplasm of breast: Secondary | ICD-10-CM

## 2017-04-14 ENCOUNTER — Ambulatory Visit
Admission: RE | Admit: 2017-04-14 | Discharge: 2017-04-14 | Disposition: A | Discharge status: Home / Self Care | Payer: Medicare Other | Source: Ambulatory Visit | Attending: Family Medicine | Admitting: Family Medicine

## 2017-04-14 ENCOUNTER — Other Ambulatory Visit: Payer: Self-pay | Admitting: Family Medicine

## 2017-04-14 DIAGNOSIS — Z1231 Encounter for screening mammogram for malignant neoplasm of breast: Secondary | ICD-10-CM

## 2018-03-18 NOTE — Discharge Instructions (Signed)
INSTRUCTIONS FOLLOWING OCULOPLASTIC SURGERY °AMY M. FOWLER, MD ° °AFTER YOUR EYE SURGERY, THER ARE MANY THINGS THWIHC YOU, THE PATIENT, CAN DO TO ASSURE THE BEST POSSIBLE RESULT FROM YOUR OPERATION.  THIS SHEET SHOULD BE REFERRED TO WHENEVER QUESTIONS ARISE.  IF THERE ARE ANY QUESTIONS NOT ANSWERED HERE, DO NOT HESITATE TO CALL OUR OFFICE AT [phone] OR [phone].  THERE IS ALWAYS [facility] AVAILABLE TO CALL IF QUESTIONS OR PROBLEMS ARISE. ° °VISION: Your vision may be blurred and out of focus after surgery until you are able to stop using your ointment, swelling resolves and your eye(s) heal. This may take 1 to 2 weeks at the least.  If your vision becomes gradually more dim or dark, this is not normal and you need to call our office immediately. ° °EYE CARE: For the first 48 hours after surgery, use ice packs frequently - “20 minutes on, 20 minutes off” - to help reduce swelling and bruising.  Small bags of frozen peas or corn make good ice packs along with cloths soaked in ice water.  If you are wearing a patch or other type of dressing following surgery, keep this on for the amount of time specified by your doctor.  For the first week following surgery, you will need to treat your stitches with great care.  If is OK to shower, but take care to not allow soapy water to run into your eye(s) to help reduce changes of infection.  You may gently clean the eyelashes and around the eye(s) with cotton balls and sterile water, BUT DO NOT RUB THE STITCHES VIGOROUSLY.  Keeping your stitches moist with ointment will help promote healing with minimal scar formation. ° °ACTIVITY: When you leave the surgery center, you should go home, rest and be inactive.  The eye(s) may feel scratchy and keeping the eyes closed will allow for faster healing.  The first week following surgery, avoid straining (anything making the face turn red) or lifting over 20 pounds.  Additionally, avoid bending which causes your head to go below  your waist.  Using your eyes will NOT harm them, so feel free to read, watch television, use the computer, etc as desired.  Driving depends on each individual, so check with your doctor if you have questions about driving. ° °MEDICATIONS:  You will be given a prescription for an ointment to use 4 times a day on your stitches.  You can use the ointment in your eyes if they feel scratchy or irritated.  If you eyelid(s) don’t close completely when you sleep, put some ointment in your eyes before bedtime. ° °EMERGENCY: If you experience SEVERE EYE PAIN OR HEADACHE UNRELIEVED BY TYLENOL OR PERCOCET, NAUSEA OR VOMITING, WORSENING REDNESS, OR WORSENING VISION (ESPECIALLY VISION THAT WA INITIALLY BETTER) CALL [phone] OR [phone] DURING BUSINESS HOURS OR AFTER HOURS. ° °General Anesthesia, Adult, Care After °These instructions provide you with information about caring for yourself after your procedure. Your health care provider may also give you more specific instructions. Your treatment has been planned according to current medical practices, but problems sometimes occur. Call your health care provider if you have any problems or questions after your procedure. °What can I expect after the procedure? °After the procedure, it is common to have: °· Vomiting. °· A sore throat. °· Mental slowness. ° °It is common to feel: °· Nauseous. °· Cold or shivery. °· Sleepy. °· Tired. °· Sore or achy, even in parts of your body where you did not have surgery. ° °  Follow these instructions at home: °For at least 24 hours after the procedure: °· Do not: °? Participate in activities where you could fall or become injured. °? Drive. °? Use heavy machinery. °? Drink alcohol. °? Take sleeping pills or medicines that cause drowsiness. °? Make important decisions or sign legal documents. °? Take care of children on your own. °· Rest. °Eating and drinking °· If you vomit, drink water, juice, or soup when you can drink without  vomiting. °· Drink enough fluid to keep your urine clear or pale yellow. °· Make sure you have little or no nausea before eating solid foods. °· Follow the diet recommended by your health care provider. °General instructions °· Have a responsible adult stay with you until you are awake and alert. °· Return to your normal activities as told by your health care provider. Ask your health care provider what activities are safe for you. °· Take over-the-counter and prescription medicines only as told by your health care provider. °· If you smoke, do not smoke without supervision. °· Keep all follow-up visits as told by your health care provider. This is important. °Contact a health care provider if: °· You continue to have nausea or vomiting at home, and medicines are not helpful. °· You cannot drink fluids or start eating again. °· You cannot urinate after 8-12 hours. °· You develop a skin rash. °· You have fever. °· You have increasing redness at the site of your procedure. °Get help right away if: °· You have difficulty breathing. °· You have chest pain. °· You have unexpected bleeding. °· You feel that you are having a life-threatening or urgent problem. °This information is not intended to replace advice given to you by your health care provider. Make sure you discuss any questions you have with your health care provider. °Document Released: 12/28/2000 Document Revised: 02/24/2016 Document Reviewed: 09/05/2015 °Elsevier Interactive Patient Education © 2018 [employer] ° °

## 2018-03-22 ENCOUNTER — Ambulatory Visit: Payer: Medicare Other | Admitting: Anesthesiology

## 2018-03-22 ENCOUNTER — Ambulatory Visit
Admission: RE | Admit: 2018-03-22 | Discharge: 2018-03-22 | Disposition: A | Discharge status: Home / Self Care | Payer: Medicare Other | Source: Ambulatory Visit | Attending: Ophthalmology | Admitting: Ophthalmology

## 2018-03-22 ENCOUNTER — Encounter
Admission: RE | Disposition: A | Discharge status: Home / Self Care | Payer: Self-pay | Source: Ambulatory Visit | Attending: Ophthalmology

## 2018-03-22 DIAGNOSIS — Z87891 Personal history of nicotine dependence: Secondary | ICD-10-CM | POA: Insufficient documentation

## 2018-03-22 DIAGNOSIS — E119 Type 2 diabetes mellitus without complications: Secondary | ICD-10-CM | POA: Insufficient documentation

## 2018-03-22 DIAGNOSIS — I10 Essential (primary) hypertension: Secondary | ICD-10-CM | POA: Diagnosis not present

## 2018-03-22 DIAGNOSIS — H02831 Dermatochalasis of right upper eyelid: Secondary | ICD-10-CM | POA: Diagnosis not present

## 2018-03-22 DIAGNOSIS — H02834 Dermatochalasis of left upper eyelid: Secondary | ICD-10-CM | POA: Diagnosis present

## 2018-03-22 DIAGNOSIS — Z8541 Personal history of malignant neoplasm of cervix uteri: Secondary | ICD-10-CM | POA: Diagnosis not present

## 2018-03-22 HISTORY — PX: BROW LIFT: SHX178

## 2018-03-22 LAB — GLUCOSE, CAPILLARY
GLUCOSE-CAPILLARY: 135 mg/dL — AB (ref 65–99)
Glucose-Capillary: 113 mg/dL — ABNORMAL HIGH (ref 65–99)

## 2018-03-22 SURGERY — BLEPHAROPLASTY
Anesthesia: General | Site: Eye | Laterality: Bilateral | Wound class: Clean

## 2018-03-22 MED ORDER — LACTATED RINGERS IV SOLN
INTRAVENOUS | Status: DC
  Administered 2018-03-22: 08:00:00 via INTRAVENOUS

## 2018-03-22 MED ORDER — ERYTHROMYCIN 5 MG/GM OP OINT
TOPICAL_OINTMENT | OPHTHALMIC | Status: DC | PRN
  Administered 2018-03-22: 1 via OPHTHALMIC

## 2018-03-22 MED ORDER — ERYTHROMYCIN 5 MG/GM OP OINT: TOPICAL_OINTMENT | OPHTHALMIC | 3 refills | Status: DC

## 2018-03-22 MED ORDER — BSS IO SOLN
INTRAOCULAR | Status: DC | PRN
  Administered 2018-03-22: 15 mL

## 2018-03-22 MED ORDER — LIDOCAINE-EPINEPHRINE 2 %-1:100000 IJ SOLN
INTRAMUSCULAR | Status: DC | PRN
  Administered 2018-03-22: 2 mL via OPHTHALMIC

## 2018-03-22 MED ORDER — ALFENTANIL 500 MCG/ML IJ INJ
INJECTION | INTRAVENOUS | Status: DC | PRN
  Administered 2018-03-22 (×2): 500 ug via INTRAVENOUS

## 2018-03-22 MED ORDER — MIDAZOLAM HCL 2 MG/2ML IJ SOLN
INTRAMUSCULAR | Status: DC | PRN
  Administered 2018-03-22 (×2): 1 mg via INTRAVENOUS

## 2018-03-22 MED ORDER — OXYCODONE HCL 5 MG/5ML PO SOLN: 5.0000 mg | Freq: Once | ORAL | Status: DC | PRN

## 2018-03-22 MED ORDER — TRAMADOL HCL 50 MG PO TABS: ORAL_TABLET | ORAL | 0 refills | Status: DC

## 2018-03-22 MED ORDER — DEXMEDETOMIDINE HCL 200 MCG/2ML IV SOLN
INTRAVENOUS | Status: DC | PRN
  Administered 2018-03-22 (×3): 4 ug via INTRAVENOUS

## 2018-03-22 MED ORDER — LACTATED RINGERS IV SOLN: INTRAVENOUS | Status: DC

## 2018-03-22 MED ORDER — FENTANYL CITRATE (PF) 100 MCG/2ML IJ SOLN: 25.0000 ug | INTRAMUSCULAR | Status: DC | PRN

## 2018-03-22 MED ORDER — OXYCODONE HCL 5 MG PO TABS: 5.0000 mg | ORAL_TABLET | Freq: Once | ORAL | Status: DC | PRN

## 2018-03-22 MED ORDER — TETRACAINE HCL 0.5 % OP SOLN
OPHTHALMIC | Status: DC | PRN
  Administered 2018-03-22: 2 [drp] via OPHTHALMIC

## 2018-03-22 SURGICAL SUPPLY — 35 items
APPLICATOR COTTON TIP WD 3 STR (MISCELLANEOUS) ×3 IMPLANT
BLADE SURG 15 STRL LF DISP TIS (BLADE) ×1 IMPLANT
BLADE SURG 15 STRL SS (BLADE) ×2
CORD BIP STRL DISP 12FT (MISCELLANEOUS) ×3 IMPLANT
DRAPE HEAD BAR (DRAPES) ×3 IMPLANT
GAUZE SPONGE 4X4 12PLY STRL (GAUZE/BANDAGES/DRESSINGS) ×3 IMPLANT
GAUZE SPONGE NON-WVN 2X2 STRL (MISCELLANEOUS) ×6 IMPLANT
GLOVE SURG LX 7.0 MICRO (GLOVE) ×4
GLOVE SURG LX STRL 7.0 MICRO (GLOVE) ×2 IMPLANT
MARKER SKIN XFINE TIP W/RULER (MISCELLANEOUS) ×3 IMPLANT
NEEDLE FILTER BLUNT 18X 1/2SAF (NEEDLE) ×2
NEEDLE FILTER BLUNT 18X1 1/2 (NEEDLE) ×1 IMPLANT
NEEDLE HYPO 30X.5 LL (NEEDLE) ×6 IMPLANT
PACK DRAPE NASAL/ENT (PACKS) ×3 IMPLANT
SOL PREP PVP 2OZ (MISCELLANEOUS) ×3
SOLUTION PREP PVP 2OZ (MISCELLANEOUS) ×1 IMPLANT
SPONGE VERSALON 2X2 STRL (MISCELLANEOUS) ×12
SUT CHROMIC 4-0 (SUTURE)
SUT CHROMIC 4-0 M2 12X2 ARM (SUTURE)
SUT CHROMIC 5 0 P 3 (SUTURE) IMPLANT
SUT ETHILON 4 0 CL P 3 (SUTURE) IMPLANT
SUT MERSILENE 4-0 S-2 (SUTURE) IMPLANT
SUT PLAIN GUT (SUTURE) ×3 IMPLANT
SUT PROLENE 5 0 P 3 (SUTURE) IMPLANT
SUT PROLENE 6 0 P 1 18 (SUTURE) IMPLANT
SUT SILK 4 0 G 3 (SUTURE) IMPLANT
SUT VIC AB 5-0 P-3 18X BRD (SUTURE) IMPLANT
SUT VIC AB 5-0 P3 18 (SUTURE)
SUT VICRYL 6-0  S14 CTD (SUTURE)
SUT VICRYL 6-0 S14 CTD (SUTURE) IMPLANT
SUT VICRYL 7 0 TG140 8 (SUTURE) IMPLANT
SUTURE CHRMC 4-0 M2 12X2 ARM (SUTURE) IMPLANT
SYR 10ML LL (SYRINGE) ×3 IMPLANT
SYR 3ML LL SCALE MARK (SYRINGE) ×3 IMPLANT
WATER STERILE IRR 250ML POUR (IV SOLUTION) ×3 IMPLANT

## 2018-03-22 NOTE — Anesthesia Postprocedure Evaluation (Signed)
Anesthesia Post Note  Patient: Amanda Watson  Procedure(s) Performed: BLEPHAROPLASTY UPPER EYELID WITH EXCESS SKIN DIABETIC (Bilateral Eye)  Patient location during evaluation: PACU Anesthesia Type: General Level of consciousness: awake and alert Pain management: pain level controlled Vital Signs Assessment: post-procedure vital signs reviewed and stable Respiratory status: spontaneous breathing, nonlabored ventilation, respiratory function stable and patient connected to nasal cannula oxygen Cardiovascular status: blood pressure returned to baseline and stable Postop Assessment: no apparent nausea or vomiting Anesthetic complications: no    Kambry Takacs C

## 2018-03-22 NOTE — Transfer of Care (Signed)
Immediate Anesthesia Transfer of Care Note  Patient: Amanda Watson  Procedure(s) Performed: BLEPHAROPLASTY UPPER EYELID WITH EXCESS SKIN DIABETIC (Bilateral Eye)  Patient Location: PACU  Anesthesia Type: General  Level of Consciousness: awake, alert  and patient cooperative  Airway and Oxygen Therapy: Patient Spontanous Breathing and Patient connected to supplemental oxygen  Post-op Assessment: Post-op Vital signs reviewed, Patient's Cardiovascular Status Stable, Respiratory Function Stable, Patent Airway and No signs of Nausea or vomiting  Post-op Vital Signs: Reviewed and stable  Complications: No apparent anesthesia complications

## 2018-03-22 NOTE — Anesthesia Preprocedure Evaluation (Signed)
Anesthesia Evaluation    Airway Mallampati: II  TM Distance: >3 FB Neck ROM: Full    Dental no notable dental hx.    Pulmonary asthma , sleep apnea , former smoker,    Pulmonary exam normal breath sounds clear to auscultation       Cardiovascular hypertension, negative cardio ROS Normal cardiovascular exam Rhythm:Regular Rate:Normal     Neuro/Psych    GI/Hepatic GERD  Controlled,  Endo/Other  diabetes, Type 2  Renal/GU      Musculoskeletal   Abdominal   Peds negative pediatric ROS (+)  Hematology  (+) anemia ,   Anesthesia Other Findings   Reproductive/Obstetrics                             Anesthesia Physical Anesthesia Plan  ASA: III  Anesthesia Plan: General   Post-op Pain Management:    Induction: Intravenous  PONV Risk Score and Plan: Propofol infusion and TIVA  Airway Management Planned:   Additional Equipment:   Intra-op Plan:   Post-operative Plan: Extubation in OR  Informed Consent: I have reviewed the patients History and Physical, chart, labs and discussed the procedure including the risks, benefits and alternatives for the proposed anesthesia with the patient or authorized representative who has indicated his/her understanding and acceptance.   Dental advisory given  Plan Discussed with: CRNA  Anesthesia Plan Comments: (General IVA with natural airway. Local by surgeon)        Anesthesia Quick Evaluation

## 2018-03-22 NOTE — Interval H&P Note (Signed)
History and Physical Interval Note:  03/22/2018 9:19 AM  Amanda Watson  has presented today for surgery, with the diagnosis of H02.831  H02.834 DERMATOCHALASIS  The various methods of treatment have been discussed with the patient and family. After consideration of risks, benefits and other options for treatment, the patient has consented to  Procedure(s) with comments: BLEPHAROPLASTY UPPER EYELID WITH EXCESS SKIN DIABETIC (Bilateral) - DIABETIC-oral meds as a surgical intervention .  The patient's history has been reviewed, patient examined, no change in status, stable for surgery.  I have reviewed the patient's chart and labs.  Questions were answered to the patient's satisfaction.     Vickki Muff, Mia Winthrop M

## 2018-03-22 NOTE — H&P (Signed)
See the history and physical completed at [facility] on 03/07/18 and scanned into the chart.   

## 2018-03-22 NOTE — Anesthesia Procedure Notes (Signed)
Procedure Name: MAC Date/Time: 03/22/2018 9:44 AM Performed by: Lind Guest, CRNA Pre-anesthesia Checklist: Patient identified, Emergency Drugs available, Suction available, Patient being monitored and Timeout performed Patient Re-evaluated:Patient Re-evaluated prior to induction Oxygen Delivery Method: Nasal cannula

## 2018-03-22 NOTE — Op Note (Signed)
Preoperative Diagnosis:  Visually significant dermatochalasis bilateral upper Eyelid(s)  Postoperative Diagnosis:  Same.  Procedure(s) Performed:   Upper eyelid blepharoplasty with excess skin excision bilateral upper Eyelid(s)  Teaching Surgeon: Philis Pique. Vickki Muff, M.D.  Assistants: none  Anesthesia: MAC  Specimens: None.  Estimated Blood Loss: Minimal.  Complications: None.  Operative Findings: None Dictated  Procedure:   Allergies were reviewed and the patient is allergic to Levaquin [levofloxacin].   After the risks, benefits, complications and alternatives were discussed with the patient, appropriate informed consent was obtained and the patient was brought to the operating suite. The patient was reclined supine and a timeout was conducted.  The patient was then sedated.  Local anesthetic consisting of a 50-50 mixture of 2% lidocaine with epinephrine and 0.75% bupivacaine with added Hylenex was injected subcutaneously to both upper eyelid(s). After adequate local was instilled, the patient was prepped and draped in the usual sterile fashion for eyelid surgery.   Attention was turned to the upper eyelids. A 63m upper eyelid crease incision line was marked with calipers on both upper eyelid(s).  A pinch test was used to estimate the amount of excess skin to remove and this was marked in standard blepharoplasty style fashion. Attention was turned to the right upper eyelid. A #15 blade was used to open the premarked incision line. A skin and muscle flap was excised and hemostasis was obtained with bipolar cautery.  A small amount of the central fat pad was cauterized at its base and excised.  Attention was then turned to the opposite eyelid where the same procedure was performed in the same manner. Hemostasis was obtained with bipolar cautery throughout. All incisions were then closed with a combination of running and interrupted 6-0 fast absorbing plain suture. The patient tolerated  the procedure well.  Erythromycin ophthalmic ointment was applied to her incision sites, followed by ice packs. She was taken to the recovery area where she recovered without difficulty.  Post-Op Plan/Instructions:  The patient was instructed to use ice packs frequently for the next 48 hours. She was instructed to use erythromycin ophthalmic ointment on her incisions 4 times a day for the next 12 to 14 days. She was given a prescription for Percocet for pain control should Tylenol not be effective. [facility] was [facility] [place] as needed for problems.  Zayyan Mullen M. FVickki Muff M.D. Attending,Ophthalmology

## 2018-03-23 ENCOUNTER — Encounter: Payer: Self-pay | Admitting: Ophthalmology

## 2018-03-29 ENCOUNTER — Other Ambulatory Visit: Payer: Self-pay | Admitting: Family Medicine

## 2018-03-29 DIAGNOSIS — Z1231 Encounter for screening mammogram for malignant neoplasm of breast: Secondary | ICD-10-CM

## 2018-04-26 ENCOUNTER — Ambulatory Visit
Admission: RE | Admit: 2018-04-26 | Discharge: 2018-04-26 | Disposition: A | Discharge status: Home / Self Care | Payer: Medicare Other | Source: Ambulatory Visit | Attending: Family Medicine | Admitting: Family Medicine

## 2018-04-26 ENCOUNTER — Encounter (INDEPENDENT_AMBULATORY_CARE_PROVIDER_SITE_OTHER): Payer: Self-pay

## 2018-04-26 DIAGNOSIS — Z1231 Encounter for screening mammogram for malignant neoplasm of breast: Secondary | ICD-10-CM | POA: Diagnosis present

## 2019-06-06 ENCOUNTER — Other Ambulatory Visit: Payer: Self-pay | Admitting: Family Medicine

## 2019-06-06 DIAGNOSIS — Z1231 Encounter for screening mammogram for malignant neoplasm of breast: Secondary | ICD-10-CM

## 2019-06-26 ENCOUNTER — Other Ambulatory Visit: Payer: Self-pay

## 2019-06-26 ENCOUNTER — Ambulatory Visit
Admission: RE | Admit: 2019-06-26 | Discharge: 2019-06-26 | Disposition: A | Discharge status: Home / Self Care | Payer: Medicare Other | Source: Ambulatory Visit | Attending: Family Medicine | Admitting: Family Medicine

## 2019-06-26 DIAGNOSIS — Z1231 Encounter for screening mammogram for malignant neoplasm of breast: Secondary | ICD-10-CM | POA: Diagnosis not present

## 2020-05-14 ENCOUNTER — Other Ambulatory Visit: Payer: Self-pay

## 2020-05-14 ENCOUNTER — Encounter: Payer: Self-pay | Admitting: Emergency Medicine

## 2020-05-14 ENCOUNTER — Emergency Department: Payer: Medicare PPO

## 2020-05-14 DIAGNOSIS — Z7951 Long term (current) use of inhaled steroids: Secondary | ICD-10-CM | POA: Diagnosis not present

## 2020-05-14 DIAGNOSIS — E119 Type 2 diabetes mellitus without complications: Secondary | ICD-10-CM | POA: Insufficient documentation

## 2020-05-14 DIAGNOSIS — Z87891 Personal history of nicotine dependence: Secondary | ICD-10-CM | POA: Insufficient documentation

## 2020-05-14 DIAGNOSIS — Z7982 Long term (current) use of aspirin: Secondary | ICD-10-CM | POA: Insufficient documentation

## 2020-05-14 DIAGNOSIS — J45909 Unspecified asthma, uncomplicated: Secondary | ICD-10-CM | POA: Diagnosis not present

## 2020-05-14 DIAGNOSIS — Z79899 Other long term (current) drug therapy: Secondary | ICD-10-CM | POA: Insufficient documentation

## 2020-05-14 DIAGNOSIS — Z8541 Personal history of malignant neoplasm of cervix uteri: Secondary | ICD-10-CM | POA: Insufficient documentation

## 2020-05-14 DIAGNOSIS — Y999 Unspecified external cause status: Secondary | ICD-10-CM | POA: Insufficient documentation

## 2020-05-14 DIAGNOSIS — Y92009 Unspecified place in unspecified non-institutional (private) residence as the place of occurrence of the external cause: Secondary | ICD-10-CM | POA: Insufficient documentation

## 2020-05-14 DIAGNOSIS — Z96652 Presence of left artificial knee joint: Secondary | ICD-10-CM | POA: Diagnosis not present

## 2020-05-14 DIAGNOSIS — I10 Essential (primary) hypertension: Secondary | ICD-10-CM | POA: Diagnosis not present

## 2020-05-14 DIAGNOSIS — S8391XA Sprain of unspecified site of right knee, initial encounter: Secondary | ICD-10-CM | POA: Diagnosis not present

## 2020-05-14 DIAGNOSIS — Y9301 Activity, walking, marching and hiking: Secondary | ICD-10-CM | POA: Diagnosis not present

## 2020-05-14 DIAGNOSIS — Z7984 Long term (current) use of oral hypoglycemic drugs: Secondary | ICD-10-CM | POA: Insufficient documentation

## 2020-05-14 DIAGNOSIS — X58XXXA Exposure to other specified factors, initial encounter: Secondary | ICD-10-CM | POA: Insufficient documentation

## 2020-05-14 DIAGNOSIS — S8991XA Unspecified injury of right lower leg, initial encounter: Secondary | ICD-10-CM | POA: Diagnosis present

## 2020-05-14 NOTE — ED Triage Notes (Signed)
Pt to triage via w/c with no distress noted; st PTA "twisted" rt leg and felt "pop"; c/o pain behind knee radiating down leg since

## 2020-05-15 ENCOUNTER — Emergency Department
Admission: EM | Admit: 2020-05-15 | Discharge: 2020-05-15 | Disposition: A | Discharge status: Home / Self Care | Payer: Medicare PPO | Attending: Emergency Medicine | Admitting: Emergency Medicine

## 2020-05-15 DIAGNOSIS — S8391XA Sprain of unspecified site of right knee, initial encounter: Secondary | ICD-10-CM

## 2020-05-15 MED ORDER — ACETAMINOPHEN 500 MG PO TABS: 1000.0000 mg | ORAL_TABLET | Freq: Three times a day (TID) | ORAL | 0 refills | Status: AC | PRN

## 2020-05-15 MED ORDER — ACETAMINOPHEN 500 MG PO TABS
1000.0000 mg | ORAL_TABLET | Freq: Once | ORAL | Status: AC
  Administered 2020-05-15: 1000 mg via ORAL
  Filled 2020-05-15: qty 2

## 2020-05-15 MED ORDER — TRAMADOL HCL 50 MG PO TABS
50.0000 mg | ORAL_TABLET | Freq: Once | ORAL | Status: AC
  Administered 2020-05-15: 50 mg via ORAL
  Filled 2020-05-15: qty 1

## 2020-05-15 MED ORDER — DICLOFENAC SODIUM 1 % EX GEL: 2.0000 g | Freq: Four times a day (QID) | CUTANEOUS | 0 refills | Status: DC

## 2020-05-15 MED ORDER — TRAMADOL HCL 50 MG PO TABS: 50.0000 mg | ORAL_TABLET | Freq: Four times a day (QID) | ORAL | 0 refills | Status: DC | PRN

## 2020-05-15 NOTE — Discharge Instructions (Signed)
Pain control: Take tylenol 1000mg  every 8 hours. Apply Volatren gel and massage it around your knee 4 times a day.Take 50mg  of tramadol every 6 hours for breakthrough pain. If you need the tramadol make sure to take one senokot as well to prevent constipation.  Do not drink alcohol, drive or participate in any other potentially dangerous activities while taking this medication as it may make you sleepy. Do not take this medication with any other sedating medications, either prescription or over-the-counter.

## 2020-05-15 NOTE — ED Notes (Signed)
Patient discharged to home per MD order. Patient in stable condition, and deemed medically cleared by ED provider for discharge. Discharge instructions reviewed with patient/family using "Teach Back"; verbalized understanding of medication education and administration, and information about follow-up care. Denies further concerns. ° °

## 2020-05-15 NOTE — ED Provider Notes (Signed)
[facility] Emergency Department Provider Note  ____________________________________________  Time seen: Approximately 5:36 AM  I have reviewed the triage vital signs and the nursing notes.   HISTORY  Chief Complaint Knee Pain   HPI Amanda Watson is a 69 y.o. female who presents for evaluation of right knee pain.  Patient reports that she was reaching into a cupboard earlier today when she felt a pop in her right knee and developed immediate pain.  She has been walking around the house with a walker trying to not put weight on it.  The pain is worse with weightbearing.  She said that she was able to hold herself and sit on a chair and did not sustain a fall.  She denies any prior problems with the knee or surgeries.  She describes the pain as sharp located in the posterior aspect of the knee that is only present with weightbearing.  She tried heat, ice, Tylenol at home with no significant improvement.   Past Medical History:  Diagnosis Date  . Anemia   . Asthma   . Cancer ([place]) 1999   cervical ca  . Diabetes mellitus without complication ([place])   . GERD (gastroesophageal reflux disease)   . History of Bell's palsy   . Hypercholesteremia   . Hyperlipidemia   . Hypertension   . Multinodular goiter   . Psoriasis   . Sleep apnea    uses CPAP  . Spinal stenosis     There are no problems to display for this patient.   Past Surgical History:  Procedure Laterality Date  . BROW LIFT Bilateral 03/22/2018   Procedure: BLEPHAROPLASTY UPPER EYELID WITH EXCESS SKIN DIABETIC;  Surgeon: Karle Starch, MD;  Location: [facility];  Service: Ophthalmology;  Laterality: Bilateral;  DIABETIC-oral meds  . CARPAL TUNNEL RELEASE Bilateral   . CHOLECYSTECTOMY    . JOINT REPLACEMENT Left    hip  . TOTAL HIP ARTHROPLASTY Left   . UMBILICAL HERNIA REPAIR N/A 06/14/2015   Procedure: HERNIA REPAIR UMBILICAL ADULT;  Surgeon: Leonie Green, MD;  Location: [facility]  ORS;  Service: General;  Laterality: N/A;    Prior to Admission medications   Medication Sig Start Date End Date Taking? Authorizing Provider  acetaminophen (TYLENOL) 500 MG tablet Take 2 tablets (1,000 mg total) by mouth every 8 (eight) hours as needed for mild pain, moderate pain, fever or headache. 05/15/20 05/15/21  Rudene Re, MD  albuterol (PROVENTIL HFA;VENTOLIN HFA) 108 (90 BASE) MCG/ACT inhaler Inhale 1 puff into the lungs every 6 (six) hours as needed for wheezing or shortness of breath.    [provider]  aspirin 81 MG tablet Take 81 mg by mouth 2 (two) times daily.    [provider]  diclofenac Sodium (VOLTAREN) 1 % GEL Apply 2 g topically 4 (four) times daily. 05/15/20   Rudene Re, MD  docusate sodium (COLACE) 100 MG capsule Take 100 mg by mouth 3 (three) times daily.    [provider]  erythromycin [facility]) ophthalmic ointment Use a small amount on your sutures 4 times a day for the next 2 weeks. Switch to Aquaphor ointment should allergy develop. 03/22/18   Karle Starch, MD  fexofenadine (ALLEGRA) 180 MG tablet Take 180 mg by mouth daily.    [provider]  Fluticasone-Salmeterol (ADVAIR) 250-50 MCG/DOSE AEPB Inhale 1 puff into the lungs 2 (two) times daily.    [provider]  HYDROcodone-acetaminophen (NORCO) 5-325 MG per tablet  Take 1-2 tablets by mouth every 4 (four) hours as needed for moderate pain. Patient not taking: Reported on 03/16/2018 06/14/15   Leonie Green, MD  Magnesium 250 MG TABS Take 1 tablet by mouth every evening.    [provider]  metFORMIN (GLUCOPHAGE) 1000 MG tablet Take 500 mg by mouth 2 (two) times daily with a meal.    [provider]  mometasone (NASONEX) 50 MCG/ACT nasal spray Place 2 sprays into the nose daily as needed.    [provider]  montelukast (SINGULAIR) 10 MG tablet Take 10 mg by mouth at bedtime.    [provider]  niacin (NIASPAN) 500  MG CR tablet Take 750 mg by mouth at bedtime.    [provider]  Nutritional Supplements (EQ ESTROBLEND MENOPAUSE PO) Take 1 capsule by mouth every evening.    [provider]  Omega-3 Fatty Acids (FISH OIL) 1000 MG CAPS Take 1 capsule by mouth daily.    [provider]  Potassium Gluconate 550 MG TABS Take 1 tablet by mouth every morning.    [provider]  ranitidine (ZANTAC) 150 MG tablet Take 150 mg by mouth 2 (two) times daily.    [provider]  simvastatin (ZOCOR) 40 MG tablet Take 40 mg by mouth every evening.    [provider]  telmisartan-hydrochlorothiazide (MICARDIS HCT) 40-12.5 MG per tablet Take 1 tablet by mouth daily.    [provider]  traMADol (ULTRAM) 50 MG tablet Take 1 tablet (50 mg total) by mouth every 6 (six) hours as needed. 05/15/20 05/15/21  Rudene Re, MD    Allergies Levaquin [levofloxacin]  Family History  Problem Relation Age of Onset  . Breast cancer Sister 51    Social History Social History   Tobacco Use  . Smoking status: Former Research scientist (life sciences)  . Smokeless tobacco: Never Used  Vaping Use  . Vaping Use: Never used  Substance Use Topics  . Alcohol use: No  . Drug use: No    Review of Systems  Constitutional: Negative for fever. Eyes: Negative for visual changes. ENT: Negative for sore throat. Neck: No neck pain  Cardiovascular: Negative for chest pain. Respiratory: Negative for shortness of breath. Gastrointestinal: Negative for abdominal pain, vomiting or diarrhea. Genitourinary: Negative for dysuria. Musculoskeletal: Negative for back pain. + R knee pain Skin: Negative for rash. Neurological: Negative for headaches, weakness or numbness. Psych: No SI or HI  ____________________________________________   PHYSICAL EXAM:  VITAL SIGNS: ED Triage Vitals  Enc Vitals Group     BP 05/14/20 1930 (!) 142/60     Pulse Rate 05/14/20 1930 68     Resp 05/14/20 1930 20      Temp 05/14/20 1930 98.5 F (36.9 C)     Temp Source 05/14/20 1930 Oral     SpO2 05/14/20 1930 96 %     Weight 05/14/20 1930 220 lb (99.8 kg)     Height 05/14/20 1930 5' (1.524 m)     Head Circumference --      Peak Flow --      Pain Score 05/14/20 1953 6     Pain Loc --      Pain Edu? --      Excl. in [place]? --     Constitutional: Alert and oriented. Well appearing and in no apparent distress. HEENT:      Head: Normocephalic and atraumatic.         Eyes: Conjunctivae are normal. Sclera is non-icteric.  Mouth/Throat: Mucous membranes are moist.       Neck: Supple with no signs of meningismus. Cardiovascular: Regular rate and rhythm.  Respiratory: Normal respiratory effort.  Musculoskeletal: No swelling, erythema, deformity of the knee.  Full range of motion with no significant pain. Neurologic: Normal speech and language. Face is symmetric. Moving all extremities. No gross focal neurologic deficits are appreciated. Skin: Skin is warm, dry and intact. No rash noted. Psychiatric: Mood and affect are normal. Speech and behavior are normal.  ____________________________________________   LABS (all labs ordered are listed, but only abnormal results are displayed)  Labs Reviewed - No data to display ____________________________________________  EKG  none  ____________________________________________  RADIOLOGY  I have personally reviewed the images performed during this visit and I agree with the Radiologist's read.   Interpretation by Radiologist:  DG Knee Complete 4 Views Right  Result Date: 05/14/2020 CLINICAL DATA:  Pain EXAM: RIGHT KNEE - COMPLETE 4+ VIEW COMPARISON:  None. FINDINGS: Joint spaces are maintained. No acute bony abnormality. Specifically, no fracture, subluxation, or dislocation. Small joint effusion. Soft tissues are intact. IMPRESSION: Small joint effusion.  No acute bony abnormality. Electronically Signed   By: Rolm Baptise M.D.   On: 05/14/2020 20:20       ____________________________________________   PROCEDURES  Procedure(s) performed: None Procedures Critical Care performed:  None ____________________________________________   INITIAL IMPRESSION / ASSESSMENT AND PLAN / ED COURSE   69 y.o. female who presents for evaluation of right knee pain after she felt a pop in the posterior aspect of her knee.  Exam is benign with no signs of fracture, dislocation, ecchymosis, swelling, cellulitis, edema, septic joint.  X-ray reviewed showing a small joint effusion but no bony abnormalities.  Possible sprain versus ligament/meniscal injury.  Patient has a walker at home.  Recommended to avoid weightbearing, RICE therapy, topical Voltaren gel, extra strength Tylenol 1000 mg 3 times daily and tramadol as needed as needed.  Recommended follow-up with [facility] if symptoms have not improved within 5 to 7 days.  Discussed follow-up with PCP.  History gathered from patient and her husband is at bedside.  Plan discussed with both of them.  Old medical records reviewed.      _____________________________________________ Please note:  Patient was evaluated in Emergency Department today for the symptoms described in the history of present illness. Patient was evaluated in the context of the global COVID-19 pandemic, which necessitated consideration that the patient might be at risk for infection with the SARS-CoV-2 virus that causes COVID-19. Institutional protocols and algorithms that pertain to the evaluation of patients at risk for COVID-19 are in a state of rapid change based on information released by regulatory bodies including the CDC and federal and state organizations. These policies and algorithms were followed during the patient's care in the ED.  Some ED evaluations and interventions may be delayed as a result of limited staffing during the pandemic.   [place] Controlled Substance Database was reviewed by  me. ____________________________________________   FINAL CLINICAL IMPRESSION(S) / ED DIAGNOSES   Final diagnoses:  Sprain of right knee, unspecified ligament, initial encounter      NEW MEDICATIONS STARTED DURING THIS VISIT:  ED Discharge Orders         Ordered    diclofenac Sodium (VOLTAREN) 1 % GEL  4 times daily     Discontinue  Reprint     05/15/20 0535    acetaminophen (TYLENOL) 500 MG tablet  Every 8 hours PRN  Discontinue  Reprint     05/15/20 0535    traMADol (ULTRAM) 50 MG tablet  Every 6 hours PRN     Discontinue  Reprint     05/15/20 0535           Note:  This document was prepared using Dragon voice recognition software and may include unintentional dictation errors.    Alfred Levins, [place], MD 05/15/20 [id number]

## 2020-05-17 ENCOUNTER — Other Ambulatory Visit: Payer: Self-pay | Admitting: Obstetrics and Gynecology

## 2020-05-30 ENCOUNTER — Encounter
Admission: RE | Admit: 2020-05-30 | Discharge: 2020-05-30 | Disposition: A | Discharge status: Home / Self Care | Payer: Medicare PPO | Source: Ambulatory Visit | Attending: Obstetrics and Gynecology | Admitting: Obstetrics and Gynecology

## 2020-05-30 ENCOUNTER — Other Ambulatory Visit: Payer: Self-pay

## 2020-05-30 NOTE — Patient Instructions (Signed)
Your procedure is scheduled on: Friday June 07, 2020. Report to Day Surgery inside [facility] 2nd floor. To find out your arrival time please call [phone] between 1PM - 3PM on Thursday June 06, 2020.  Remember: Instructions that are not followed completely may result in serious medical risk,  up to and including death, or upon the discretion of your surgeon and anesthesiologist your  surgery may need to be rescheduled.     _X__ 1. Do not eat food after midnight the night before your procedure.                 No chewing gum or hard candies. You may drink clear liquids up to 2 hours                 before you are scheduled to arrive for your surgery- DO not drink clear                 liquids within 2 hours of the start of your surgery.                 Clear Liquids include:  water, apple juice without pulp, clear Gatorade, G2 or                  Gatorade Zero (avoid Red/Purple/Blue), Black Coffee or Tea (Do not add                 anything to coffee or tea).  _____2.   Complete the "Ensure Clear Pre-surgery Clear Carbohydrate Drink" provided to you, 2 hours before arrival. **If you are diabetic you will be provided with an alternative drink, Gatorade Zero or G2.  __X__2.  On the morning of surgery brush your teeth with toothpaste and water, you                may rinse your mouth with mouthwash if you wish.  Do not swallow any toothpaste of mouthwash.     _X__ 3.  No Alcohol for 24 hours before or after surgery.   _X__ 4.  Do Not Smoke or use e-cigarettes For 24 Hours Prior to Your Surgery.                 Do not use any chewable tobacco products for at least 6 hours prior to                 Surgery.  _X__  5.  Do not use any recreational drugs (marijuana, cocaine, heroin, ecstasy, MDMA or other)                For at least one week prior to your surgery.  Combination of these drugs with anesthesia                May have life threatening  results.  __X__  7.  Notify your doctor if there is any change in your medical condition      (cold, fever, infections).     Do not wear jewelry, make-up, hairpins, clips or nail polish. Do not wear lotions, powders, or perfumes. You may wear deodorant. Do not shave 48 hours prior to surgery. Men may shave face and neck. Do not bring valuables to the hospital.    [facility] is not responsible for any belongings or valuables.  Contacts, dentures or bridgework may not be worn into surgery. Leave your suitcase in the car. After surgery it may be brought to your room. For  patients admitted to the hospital, discharge time is determined by your treatment team.   Patients discharged the day of surgery will not be allowed to drive home.   Make arrangements for someone to be with you for the first 24 hours of your Same Day Discharge.   __X__ Take these medicines the morning of surgery with A SIP OF WATER:    1. omeprazole (PRILOSEC) 20 MG capsule  2.    ____ Use CHG Soap (or wipes) as directed  __X__ Use inhalers on the day of surgery  albuterol (PROVENTIL HFA;VENTOLIN HFA) 108 (90 BASE) MCG/ACT inhaler  Fluticasone-Salmeterol (ADVAIR) 250-50 MCG/DOSE AEPB  mometasone (NASONEX) 50 MCG/ACT nasal spray  __X__ Stop metformin 2 days prior to surgery (Last dose will be August 31)   __X__ Stop aspirin as instructed by your doctor.  __X__ Stop Anti-inflammatories such as ibuprofen, Aleve, Advil, naprosyn, and or BC powders.    __X__ Stop supplements until after surgery.    __X__ Do not start any herbal supplements before your surgery.   __X__ Bring C-Pap to the hospital.    If you have any questions regarding your pre-procedure instructions,  Please call Pre-admit Testing at [phone]

## 2020-06-03 ENCOUNTER — Encounter
Admission: RE | Admit: 2020-06-03 | Discharge: 2020-06-03 | Disposition: A | Discharge status: Home / Self Care | Payer: Medicare PPO | Source: Ambulatory Visit | Attending: Obstetrics and Gynecology | Admitting: Obstetrics and Gynecology

## 2020-06-03 ENCOUNTER — Other Ambulatory Visit: Payer: Self-pay

## 2020-06-03 DIAGNOSIS — I10 Essential (primary) hypertension: Secondary | ICD-10-CM | POA: Insufficient documentation

## 2020-06-04 NOTE — H&P (Deleted)
  The note originally documented on this encounter has been moved the the encounter in which it belongs.  

## 2020-06-04 NOTE — H&P (Signed)
Amanda Watson is a 69 y.o. female here for Fx D+C , hysteroscopy  For EMBX results :  Comment: Specimen A-Endometrial Biopsy: ENDOMETRIAL ATYPICAL  HYPERPLASIA, CANNOT EXCLUDE A WELL DIFFERENTIATED  ENDOMETRIAL ADENOCARCINOMA.. Pap neg   U/s today : Large ut seen  Fibroid mid ut=89 mm   Endometrium=24.54 mm  Neither ov seen  No adnexal masses seen  Past Medical History:  has a past medical history of Anemia, Asthma without status asthmaticus, unspecified, Chronic obstructive asthma, unspecified (CMS-HCC), Diabetes mellitus type 2, uncomplicated (CMS-HCC), Esophageal reflux, Essential hypertension, benign, GERD (gastroesophageal reflux disease), History of Bell's palsy (February 2011), History of chickenpox, Hypercholesterolemia, Hypersomnia with sleep apnea, unspecified, Multinodular goiter, Other and unspecified hyperlipidemia, Psoriasis, Pulmonary nodules, Sleep apnea, and Spinal stenosis.  Past Surgical History:  has a past surgical history that includes Cholecystectomy; Carpal tunnel repair (Left, December 2005); Carpal tunnel repair (Right, 2006); other surgery; Hernia repair (06/14/2015); Tubal ligation; and Cesarean section. Family History: family history includes Heart failure in her mother; Myocardial Infarction (Heart attack) in her father. Social History:  reports that she quit smoking about 22 years ago. Her smoking use included cigarettes. She has a 10.00 pack-year smoking history. She has never used smokeless tobacco. She reports that she does not drink alcohol and does not use drugs. OB/GYN History:          OB History    Gravida  4   Para  3   Term      Preterm      AB  1   Living  2     SAB      TAB      Ectopic      Molar      Multiple      Live Births  2          Allergies: is allergic to levaquin [levofloxacin]. Medications:  Current Outpatient Medications:  .  ACCU-CHEK AVIVA CONTROL SOLN Soln, Use 1 each as directed, Disp: 1  each, Rfl: 2 .  ACCU-CHEK AVIVA PLUS METER Misc, 1 each by XX route as directed, Disp: 1 each, Rfl: 0 .  albuterol 90 mcg/actuation inhaler, Inhale 2 puffs using inhaler every four to six hours as needed [for SOB/wheeze], Disp: , Rfl:  .  aspirin (ASPIRIN LOW DOSE) 81 MG EC tablet, Take 81 mg by mouth once daily  , Disp: , Rfl:  .  azelastine (ASTELIN) 137 mcg nasal spray, Place 1 spray into both nostrils 2 (two) times daily, Disp: 30 mL, Rfl: 1 .  blood glucose diagnostic (ACCU-CHEK AVIVA PLUS TEST STRP) test strip, 1 each (1 strip total) by XX route 2 (two) times daily, Disp: 720 each, Rfl: 0 .  calcium carbonate-vitamin D3 (CALCIUM 600 + D,3,) 600 mg calcium- 200 unit Cap, Take 1 capsule by mouth once daily., Disp: , Rfl:  .  cyanocobalamin (VITAMIN B12) 1000 MCG tablet, Take 1,000 mcg by mouth once daily, Disp: , Rfl:  .  docusate (COLACE) 100 MG capsule, Take 100 mg by mouth 2 (two) times daily as needed., Disp: , Rfl:  .  ferrous sulfate 325 (65 FE) MG tablet, Take 325 mg by mouth every other day Takes 1-2 times a week due to constipation  , Disp: , Rfl:  .  fish oil-dha-epa 1,200-144-216 mg Cap, 1 capsule. At bedtime, Disp: , Rfl:  .  fluticasone propion-salmeteroL (ADVAIR DISKUS) 250-50 mcg/dose diskus inhaler, Inhale 1 inhalation into the lungs every 12 (twelve) hours, Disp: 3 Inhaler,  Rfl: 3 .  gabapentin (NEURONTIN) 100 MG capsule, Take 1 capsule by mouth twice daily, Disp: 60 capsule, Rfl: 1 .  lancets, Use 1 each 2 (two) times daily, Disp: 720 each, Rfl: 0 .  magnesium oxide (MAG-OX) 400 mg tablet, Take 1 tablet by mouth once daily., Disp: , Rfl:  .  metFORMIN (GLUCOPHAGE) 1000 MG tablet, Take 1/2 (one-half) tablet by mouth twice daily, Disp: 90 tablet, Rfl: 0 .  montelukast (SINGULAIR) 10 mg tablet, Take 1 tablet by mouth in the evening, Disp: 90 tablet, Rfl: 2 .  niacin (NIASPAN) 500 MG ER tablet, Take 1 tablet by mouth once daily, Disp: 90 tablet, Rfl: 0 .  omeprazole (PRILOSEC) 20  MG DR capsule, Take 1 capsule by mouth once daily, Disp: 90 capsule, Rfl: 3 .  potassium gluconate 550 mg (90 mg) Tab, Take 1 tablet by mouth once daily., Disp: , Rfl:  .  simvastatin (ZOCOR) 40 MG tablet, Take 1 tablet by mouth in the evening, Disp: 90 tablet, Rfl: 0 .  telmisartan-hydrochlorothiazide (MICARDIS HCT) 40-12.5 mg tablet, Take 1 tablet by mouth once daily, Disp: 90 tablet, Rfl: 0 .  triamcinolone 0.1 % cream, Apply topically 2 (two) times daily, Disp: 30 g, Rfl: 0  Review of Systems: General:                      No fatigue or weight loss Eyes:                           No vision changes Ears:                            No hearing difficulty Respiratory:                No cough or shortness of breath Pulmonary:                  No asthma or shortness of breath Cardiovascular:           No chest pain, palpitations, dyspnea on exertion Gastrointestinal:          No abdominal bloating, chronic diarrhea, constipations, masses, pain or hematochezia Genitourinary:             No hematuria, dysuria, abnormal vaginal discharge, pelvic pain, Menometrorrhagia Lymphatic:                   No swollen lymph nodes Musculoskeletal:         No muscle weakness Neurologic:                  No extremity weakness, syncope, seizure disorder Psychiatric:                  No history of depression, delusions or suicidal/homicidal ideation    Exam:      Vitals:   05/16/20 1534  BP: 140/76    Body mass index is 42.97 kg/m.  WDWN white/  female in NAD   Lungs: CTA  CV : RRR without murmur   Neck:  no thyromegaly Abdomen: soft , no mass, normal active bowel sounds,  non-tender, no rebound tenderness Pelvic: tanner stage 5 ,  External genitalia: vulva /labia no lesions Urethra: no prolapse Vagina:Cervix is high ,normal physiologic d/cadequate room for TVH if need be . 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 If cancer gyn onc eval . If not treatment options  will  Be discuss with her  Benefits and risks to surgery: The proposed benefit of the surgery has been discussed with the patient. The possible risks include, but are not limited to: organ injury to the bowel , bladder, ureters, and major blood vessels and nerves. There is a possibility of additional surgeries resulting from these injuries. There is also the risk of blood transfusion and the need to receive blood products during or after the procedure which may rarely lead to HIV or Hepatitis C infection. There is a risk of developing a deep venous thrombosis or a pulmonary embolism . There is the possibility of wound infection and also anesthetic complications, even the rare possibility of death. The patient understands these risks and wishes to proceed. All questions have been answered and the consent has been signed.     Caroline Sauger, MD       Electronically signed by Caroline Sauger, MD on 05/16/2020 6:13 PM

## 2020-06-05 ENCOUNTER — Other Ambulatory Visit: Payer: Self-pay

## 2020-06-05 ENCOUNTER — Other Ambulatory Visit
Admission: RE | Admit: 2020-06-05 | Discharge: 2020-06-05 | Disposition: A | Discharge status: Home / Self Care | Payer: Medicare PPO | Source: Ambulatory Visit | Attending: Obstetrics and Gynecology | Admitting: Obstetrics and Gynecology

## 2020-06-05 DIAGNOSIS — Z20822 Contact with and (suspected) exposure to covid-19: Secondary | ICD-10-CM | POA: Insufficient documentation

## 2020-06-05 DIAGNOSIS — Z01812 Encounter for preprocedural laboratory examination: Secondary | ICD-10-CM | POA: Insufficient documentation

## 2020-06-05 LAB — SARS CORONAVIRUS 2 (TAT 6-24 HRS): SARS Coronavirus 2: NEGATIVE

## 2020-06-07 ENCOUNTER — Ambulatory Visit
Admission: RE | Admit: 2020-06-07 | Payer: Medicare PPO | Source: Ambulatory Visit | Admitting: Obstetrics and Gynecology

## 2020-06-12 ENCOUNTER — Other Ambulatory Visit: Payer: Self-pay

## 2020-06-12 ENCOUNTER — Other Ambulatory Visit
Admission: RE | Admit: 2020-06-12 | Discharge: 2020-06-12 | Disposition: A | Discharge status: Home / Self Care | Payer: Medicare PPO | Source: Ambulatory Visit | Attending: Obstetrics and Gynecology | Admitting: Obstetrics and Gynecology

## 2020-06-12 DIAGNOSIS — Z01812 Encounter for preprocedural laboratory examination: Secondary | ICD-10-CM | POA: Insufficient documentation

## 2020-06-12 DIAGNOSIS — Z20822 Contact with and (suspected) exposure to covid-19: Secondary | ICD-10-CM | POA: Insufficient documentation

## 2020-06-12 LAB — SARS CORONAVIRUS 2 (TAT 6-24 HRS): SARS Coronavirus 2: NEGATIVE

## 2020-06-14 ENCOUNTER — Ambulatory Visit: Payer: Medicare PPO | Admitting: Urgent Care

## 2020-06-14 ENCOUNTER — Encounter: Admission: RE | Payer: Self-pay | Source: Ambulatory Visit

## 2020-06-14 ENCOUNTER — Encounter: Payer: Self-pay | Admitting: Obstetrics and Gynecology

## 2020-06-14 ENCOUNTER — Other Ambulatory Visit: Payer: Self-pay

## 2020-06-14 ENCOUNTER — Encounter
Admission: RE | Disposition: A | Discharge status: Home / Self Care | Payer: Self-pay | Source: Home / Self Care | Attending: Obstetrics and Gynecology

## 2020-06-14 ENCOUNTER — Ambulatory Visit
Admission: RE | Admit: 2020-06-14 | Discharge: 2020-06-14 | Disposition: A | Discharge status: Home / Self Care | Payer: Medicare PPO | Attending: Obstetrics and Gynecology | Admitting: Obstetrics and Gynecology

## 2020-06-14 DIAGNOSIS — Z7951 Long term (current) use of inhaled steroids: Secondary | ICD-10-CM | POA: Insufficient documentation

## 2020-06-14 DIAGNOSIS — D649 Anemia, unspecified: Secondary | ICD-10-CM | POA: Insufficient documentation

## 2020-06-14 DIAGNOSIS — Z7982 Long term (current) use of aspirin: Secondary | ICD-10-CM | POA: Insufficient documentation

## 2020-06-14 DIAGNOSIS — Z87891 Personal history of nicotine dependence: Secondary | ICD-10-CM | POA: Insufficient documentation

## 2020-06-14 DIAGNOSIS — G4730 Sleep apnea, unspecified: Secondary | ICD-10-CM | POA: Insufficient documentation

## 2020-06-14 DIAGNOSIS — J449 Chronic obstructive pulmonary disease, unspecified: Secondary | ICD-10-CM | POA: Diagnosis not present

## 2020-06-14 DIAGNOSIS — Z7984 Long term (current) use of oral hypoglycemic drugs: Secondary | ICD-10-CM | POA: Insufficient documentation

## 2020-06-14 DIAGNOSIS — N8502 Endometrial intraepithelial neoplasia [EIN]: Secondary | ICD-10-CM | POA: Insufficient documentation

## 2020-06-14 DIAGNOSIS — E119 Type 2 diabetes mellitus without complications: Secondary | ICD-10-CM | POA: Insufficient documentation

## 2020-06-14 DIAGNOSIS — Z9049 Acquired absence of other specified parts of digestive tract: Secondary | ICD-10-CM | POA: Diagnosis not present

## 2020-06-14 DIAGNOSIS — K219 Gastro-esophageal reflux disease without esophagitis: Secondary | ICD-10-CM | POA: Diagnosis not present

## 2020-06-14 DIAGNOSIS — Z881 Allergy status to other antibiotic agents status: Secondary | ICD-10-CM | POA: Diagnosis not present

## 2020-06-14 DIAGNOSIS — I10 Essential (primary) hypertension: Secondary | ICD-10-CM | POA: Diagnosis not present

## 2020-06-14 DIAGNOSIS — Z8249 Family history of ischemic heart disease and other diseases of the circulatory system: Secondary | ICD-10-CM | POA: Insufficient documentation

## 2020-06-14 DIAGNOSIS — N950 Postmenopausal bleeding: Secondary | ICD-10-CM | POA: Diagnosis not present

## 2020-06-14 DIAGNOSIS — E785 Hyperlipidemia, unspecified: Secondary | ICD-10-CM | POA: Diagnosis not present

## 2020-06-14 DIAGNOSIS — Z79899 Other long term (current) drug therapy: Secondary | ICD-10-CM | POA: Insufficient documentation

## 2020-06-14 HISTORY — PX: HYSTEROSCOPY WITH D & C: SHX1775

## 2020-06-14 LAB — GLUCOSE, CAPILLARY
Glucose-Capillary: 111 mg/dL — ABNORMAL HIGH (ref 70–99)
Glucose-Capillary: 125 mg/dL — ABNORMAL HIGH (ref 70–99)

## 2020-06-14 SURGERY — DILATATION AND CURETTAGE /HYSTEROSCOPY
Anesthesia: Choice

## 2020-06-14 SURGERY — DILATATION AND CURETTAGE /HYSTEROSCOPY
Anesthesia: General

## 2020-06-14 MED ORDER — CHLORHEXIDINE GLUCONATE 0.12 % MT SOLN
OROMUCOSAL | Status: AC
  Administered 2020-06-14: 15 mL via OROMUCOSAL
  Filled 2020-06-14: qty 15

## 2020-06-14 MED ORDER — PROPOFOL 10 MG/ML IV BOLUS
INTRAVENOUS | Status: DC | PRN
  Administered 2020-06-14: 150 mg via INTRAVENOUS

## 2020-06-14 MED ORDER — LACTATED RINGERS IV SOLN: INTRAVENOUS | Status: DC | PRN

## 2020-06-14 MED ORDER — LIDOCAINE HCL (CARDIAC) PF 100 MG/5ML IV SOSY
PREFILLED_SYRINGE | INTRAVENOUS | Status: DC | PRN
  Administered 2020-06-14: 100 mg via INTRAVENOUS

## 2020-06-14 MED ORDER — CEFAZOLIN SODIUM-DEXTROSE 2-4 GM/100ML-% IV SOLN: 2.0000 g | Freq: Once | INTRAVENOUS | Status: DC

## 2020-06-14 MED ORDER — FENTANYL CITRATE (PF) 100 MCG/2ML IJ SOLN
INTRAMUSCULAR | Status: AC
  Filled 2020-06-14: qty 2

## 2020-06-14 MED ORDER — ROCURONIUM BROMIDE 100 MG/10ML IV SOLN
INTRAVENOUS | Status: DC | PRN
  Administered 2020-06-14: 30 mg via INTRAVENOUS

## 2020-06-14 MED ORDER — SUCCINYLCHOLINE CHLORIDE 20 MG/ML IJ SOLN
INTRAMUSCULAR | Status: DC | PRN
  Administered 2020-06-14: 140 mg via INTRAVENOUS

## 2020-06-14 MED ORDER — FENTANYL CITRATE (PF) 100 MCG/2ML IJ SOLN: 25.0000 ug | INTRAMUSCULAR | Status: DC | PRN

## 2020-06-14 MED ORDER — SUGAMMADEX SODIUM 200 MG/2ML IV SOLN
INTRAVENOUS | Status: DC | PRN
  Administered 2020-06-14: 200 mg via INTRAVENOUS

## 2020-06-14 MED ORDER — CHLORHEXIDINE GLUCONATE 0.12 % MT SOLN: 15.0000 mL | Freq: Once | OROMUCOSAL | Status: AC

## 2020-06-14 MED ORDER — FENTANYL CITRATE (PF) 100 MCG/2ML IJ SOLN
INTRAMUSCULAR | Status: DC | PRN
Start: 2020-06-14 — End: 2020-06-14
  Administered 2020-06-14: 100 ug via INTRAVENOUS

## 2020-06-14 MED ORDER — KETOROLAC TROMETHAMINE 15 MG/ML IJ SOLN: 15.0000 mg | Freq: Four times a day (QID) | INTRAMUSCULAR | Status: DC | PRN

## 2020-06-14 MED ORDER — ORAL CARE MOUTH RINSE: 15.0000 mL | Freq: Once | OROMUCOSAL | Status: AC

## 2020-06-14 MED ORDER — SODIUM CHLORIDE 0.9 % IV SOLN: INTRAVENOUS | Status: DC

## 2020-06-14 MED ORDER — ONDANSETRON HCL 4 MG/2ML IJ SOLN: 4.0000 mg | Freq: Once | INTRAMUSCULAR | Status: DC | PRN

## 2020-06-14 MED ORDER — CEFAZOLIN SODIUM-DEXTROSE 2-4 GM/100ML-% IV SOLN
INTRAVENOUS | Status: AC
  Filled 2020-06-14: qty 100

## 2020-06-14 SURGICAL SUPPLY — 24 items
BAG INFUSER PRESSURE 100CC (MISCELLANEOUS) ×3 IMPLANT
CANISTER SUCT 3000ML PPV (MISCELLANEOUS) ×3 IMPLANT
CATH ROBINSON RED A/P 16FR (CATHETERS) IMPLANT
COVER WAND RF STERILE (DRAPES) IMPLANT
DEVICE MYOSURE LITE (MISCELLANEOUS) IMPLANT
DEVICE MYOSURE REACH (MISCELLANEOUS) IMPLANT
GLOVE SURG SYN 8.0 (GLOVE) ×3 IMPLANT
GOWN STRL REUS W/ TWL LRG LVL3 (GOWN DISPOSABLE) ×1 IMPLANT
GOWN STRL REUS W/ TWL XL LVL3 (GOWN DISPOSABLE) ×1 IMPLANT
GOWN STRL REUS W/TWL LRG LVL3 (GOWN DISPOSABLE) ×2
GOWN STRL REUS W/TWL XL LVL3 (GOWN DISPOSABLE) ×2
IV NS 1000ML (IV SOLUTION) ×2
IV NS 1000ML BAXH (IV SOLUTION) ×1 IMPLANT
KIT PROCEDURE FLUENT (KITS) IMPLANT
KIT TURNOVER CYSTO (KITS) ×3 IMPLANT
PACK DNC HYST (MISCELLANEOUS) ×3 IMPLANT
PAD OB MATERNITY 4.3X12.25 (PERSONAL CARE ITEMS) ×3 IMPLANT
PAD PREP 24X41 OB/GYN DISP (PERSONAL CARE ITEMS) ×3 IMPLANT
SEAL ROD LENS SCOPE MYOSURE (ABLATOR) ×3 IMPLANT
SOL .9 NS 3000ML IRR  AL (IV SOLUTION) ×2
SOL .9 NS 3000ML IRR UROMATIC (IV SOLUTION) ×1 IMPLANT
TOWEL OR 17X26 4PK STRL BLUE (TOWEL DISPOSABLE) ×3 IMPLANT
TUBING CONNECTING 10 (TUBING) IMPLANT
TUBING CONNECTING 10' (TUBING)

## 2020-06-14 NOTE — Progress Notes (Signed)
Here for FX D+C , h/s  . LAbs reviewed . All questions answered . Proceed

## 2020-06-14 NOTE — Anesthesia Procedure Notes (Signed)
Procedure Name: Intubation Date/Time: 06/14/2020 1:46 PM Performed by: Leander Rams, CRNA Pre-anesthesia Checklist: Patient identified, Emergency Drugs available, Suction available, Patient being monitored and Timeout performed Patient Re-evaluated:Patient Re-evaluated prior to induction Oxygen Delivery Method: Circle system utilized Preoxygenation: Pre-oxygenation with 100% oxygen Ventilation: Mask ventilation without difficulty Laryngoscope Size: McGraph and 3 Grade View: Grade I Tube type: Oral Tube size: 7.0 mm Number of attempts: 1 Airway Equipment and Method: Stylet and Patient positioned with wedge pillow Placement Confirmation: ETT inserted through vocal cords under direct vision,  positive ETCO2,  CO2 detector and breath sounds checked- equal and bilateral Secured at: 20 cm Tube secured with: Tape Dental Injury: Teeth and Oropharynx as per pre-operative assessment  Difficulty Due To: Difficulty was anticipated Future Recommendations: Recommend- induction with short-acting agent, and alternative techniques readily available

## 2020-06-14 NOTE — Transfer of Care (Signed)
Immediate Anesthesia Transfer of Care Note  Patient: Amanda Watson  Procedure(s) Performed: FRACTIONAL DILATATION AND CURETTAGE /HYSTEROSCOPY (N/A )  Patient Location: PACU  Anesthesia Type:General  Level of Consciousness: drowsy  Airway & Oxygen Therapy: Patient Spontanous Breathing and Patient connected to face mask oxygen  Post-op Assessment: Report given to RN  Post vital signs: stable  Last Vitals:  Vitals Value Taken Time  BP 148/74 06/14/20 1425  Temp    Pulse 77 06/14/20 1426  Resp 20 06/14/20 1426  SpO2 98 % 06/14/20 1426  Vitals shown include unvalidated device data.  Last Pain:  Vitals:   06/14/20 1133  TempSrc: Oral  PainSc: 0-No pain         Complications: No complications documented.

## 2020-06-14 NOTE — Anesthesia Preprocedure Evaluation (Signed)
Anesthesia Evaluation  Patient identified by MRN, date of birth, ID band Patient awake    Reviewed: Allergy & Precautions, NPO status , Patient's Chart, lab work & pertinent test results  History of Anesthesia Complications Negative for: history of anesthetic complications  Airway Mallampati: III       Dental   Pulmonary asthma , sleep apnea and Continuous Positive Airway Pressure Ventilation , Not current smoker, former smoker,           Cardiovascular hypertension, Pt. on medications (-) Past MI and (-) CHF (-) dysrhythmias (-) Valvular Problems/Murmurs     Neuro/Psych neg Seizures    GI/Hepatic Neg liver ROS, GERD  Medicated and Controlled,  Endo/Other  diabetes, Type 2, Oral Hypoglycemic Agents  Renal/GU negative Renal ROS     Musculoskeletal   Abdominal   Peds  Hematology  (+) anemia ,   Anesthesia Other Findings   Reproductive/Obstetrics                             Anesthesia Physical Anesthesia Plan  ASA: III  Anesthesia Plan: General   Post-op Pain Management:    Induction: Intravenous  PONV Risk Score and Plan: 3 and Ondansetron, Midazolam and Treatment may vary due to age or medical condition  Airway Management Planned: Oral ETT  Additional Equipment:   Intra-op Plan:   Post-operative Plan:   Informed Consent: I have reviewed the patients History and Physical, chart, labs and discussed the procedure including the risks, benefits and alternatives for the proposed anesthesia with the patient or authorized representative who has indicated his/her understanding and acceptance.       Plan Discussed with:   Anesthesia Plan Comments:         Anesthesia Quick Evaluation

## 2020-06-14 NOTE — Brief Op Note (Signed)
06/14/2020  2:14 PM  PATIENT:  Amanda Watson  69 y.o. female  PRE-OPERATIVE DIAGNOSIS:  endometrial intraepithelial neoplasia  POST-OPERATIVE DIAGNOSIS:  endometrial intraepithelial neoplasia  PROCEDURE:  Procedure(s): FRACTIONAL DILATATION AND CURETTAGE /HYSTEROSCOPY (N/A) Hysteroscopy  SURGEON:  Surgeon(s) and Role:    * Lilliona Blakeney, Gwen Her, MD - Primary  PHYSICIAN ASSISTANT:   ASSISTANTS: none   ANESTHESIA:   general  EBL:  Minimal. IOF 1000 cc  uo 250 cc BLOOD ADMINISTERED:none  DRAINS: none   LOCAL MEDICATIONS USED:  NONE  SPECIMEN:  Source of Specimen:  ecc, endometrial curettage  DISPOSITION OF SPECIMEN:  PATHOLOGY  COUNTS:  yes  TOURNIQUET:  * No tourniquets in log *  DICTATION: .Other Dictation: Dictation Number verbal  PLAN OF CARE: Discharge to home after PACU  PATIENT DISPOSITION:  PACU - hemodynamically stable.   Delay start of Pharmacological VTE agent (>24hrs) due to surgical blood loss or risk of bleeding: not applicable

## 2020-06-14 NOTE — Discharge Instructions (Signed)
Dilation and Curettage or Vacuum Curettage, Care After These instructions give you information about caring for yourself after your procedure. Your doctor may also give you more specific instructions. Call your doctor if you have any problems or questions after your procedure. Follow these instructions at home: Activity  Do not drive or use heavy machinery while taking prescription pain medicine.  For 24 hours after your procedure, avoid driving.  Take short walks often, followed by rest periods. Ask your doctor what activities are safe for you. After one or two days, you may be able to return to your normal activities.  Do not lift anything that is heavier than 10 lb (4.5 kg) until your doctor approves.  For at least 2 weeks, or as long as told by your doctor: ? Do not douche. ? Do not use tampons. ? Do not have sex. General instructions   Take over-the-counter and prescription medicines only as told by your doctor. This is very important if you take blood thinning medicine.  Do not take baths, swim, or use a hot tub until your doctor approves. Take showers instead of baths.  Wear compression stockings as told by your doctor.  It is up to you to get the results of your procedure. Ask your doctor when your results will be ready.  Keep all follow-up visits as told by your doctor. This is important. Contact a doctor if:  You have very bad cramps that get worse or do not get better with medicine.  You have very bad pain in your belly (abdomen).  You cannot drink fluids without throwing up (vomiting).  You get pain in a different part of the area between your belly and thighs (pelvis).  You have bad-smelling discharge from your vagina.  You have a rash. Get help right away if:  You are bleeding a lot from your vagina. A lot of bleeding means soaking more than one sanitary pad in an hour, for 2 hours in a row.  You have clumps of blood (blood clots) coming from your  vagina.  You have a fever or chills.  Your belly feels very tender or hard.  You have chest pain.  You have trouble breathing.  You cough up blood.  You feel dizzy.  You feel light-headed.  You pass out (faint).  You have pain in your neck or shoulder area. Summary  Take short walks often, followed by rest periods. Ask your doctor what activities are safe for you. After one or two days, you may be able to return to your normal activities.  Do not lift anything that is heavier than 10 lb (4.5 kg) until your doctor approves.  Do not take baths, swim, or use a hot tub until your doctor approves. Take showers instead of baths.  Contact your doctor if you have any symptoms of infection, like bad-smelling discharge from your vagina. This information is not intended to replace advice given to you by your health care provider. Make sure you discuss any questions you have with your health care provider. Document Revised: 09/03/2017 Document Reviewed: 06/08/2016 Elsevier Patient Education  [place]. Hysteroscopy, Care After This sheet gives you information about how to care for yourself after your procedure. Your health care provider may also give you more specific instructions. If you have problems or questions, contact your health care provider. What can I expect after the procedure? After the procedure, it is common to have:  Cramping.  Bleeding. This can vary from light spotting  to menstrual-like bleeding. Follow these instructions at home: Activity  Rest for 1-2 days after the procedure.  Do not douche, use tampons, or have sex for 2 weeks after the procedure, or until your health care provider approves.  Do not drive for 24 hours after the procedure, or for as long as told by your health care provider.  Do not drive, use heavy machinery, or drink alcohol while taking prescription pain medicines. Medicines   Take over-the-counter and prescription medicines  only as told by your health care provider.  Do not take aspirin during recovery. It can increase the risk of bleeding. General instructions  Do not take baths, swim, or use a hot tub until your health care provider approves. Take showers instead of baths for 2 weeks, or for as long as told by your health care provider.  To prevent or treat constipation while you are taking prescription pain medicine, your health care provider may recommend that you: ? Drink enough fluid to keep your urine clear or pale yellow. ? Take over-the-counter or prescription medicines. ? Eat foods that are high in fiber, such as fresh fruits and vegetables, whole grains, and beans. ? Limit foods that are high in fat and processed sugars, such as fried and sweet foods.  Keep all follow-up visits as told by your health care provider. This is important. Contact a health care provider if:  You feel dizzy or lightheaded.  You feel nauseous.  You have abnormal vaginal discharge.  You have a rash.  You have pain that does not get better with medicine.  You have chills. Get help right away if:  You have bleeding that is heavier than a normal menstrual period.  You have a fever.  You have pain or cramps that get worse.  You develop new abdominal pain.  You faint.  You have pain in your shoulders.  You have shortness of breath. Summary  After the procedure, you may have cramping and some vaginal bleeding.  Do not douche, use tampons, or have sex for 2 weeks after the procedure, or until your health care provider approves.  Do not take baths, swim, or use a hot tub until your health care provider approves. Take showers instead of baths for 2 weeks, or for as long as told by your health care provider.  Report any unusual symptoms to your health care provider.  Keep all follow-up visits as told by your health care provider. This is important. This information is not intended to replace advice given to  you by your health care provider. Make sure you discuss any questions you have with your health care provider. Document Revised: 09/03/2017 Document Reviewed: 10/20/2016 Elsevier Patient Education  2020 [place]   1) The drugs that you were given will stay in your system until tomorrow so for the next 24 hours you should not:  A) Drive an automobile B) Make any legal decisions C) Drink any alcoholic beverage   2) You may resume regular meals tomorrow.  Today it is better to start with liquids and gradually work up to solid foods.  You may eat anything you prefer, but it is better to start with liquids, then soup and crackers, and gradually work up to solid foods.   3) Please notify your doctor immediately if you have any unusual bleeding, trouble breathing, redness and pain at the surgery site, drainage, fever, or pain not relieved by medication.    4) Additional Instructions:  Please contact your physician with any problems or Same Day Surgery at [phone], Monday through Friday 6 am to 4 pm, or [place] [facility] [place] Main number at [phone]. 

## 2020-06-14 NOTE — Op Note (Signed)
Amanda Watson, Amanda Watson MEDICAL RECORD [id number] ACCOUNT [id number] DATE OF BIRTH:1950-10-10 FACILITY: [facility] LOCATION: [facility] PHYSICIAN:Tobenna Needs Josefine Class, MD  OPERATIVE REPORT  DATE OF PROCEDURE:  06/14/2020  PREOPERATIVE DIAGNOSIS:   Endometrial intraepithelial neoplasia.  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  PROCEDURE: 1.  Fractional dilation and curettage. 2.  Hysteroscopy.  SURGEON:  Laverta Baltimore, MD  ANESTHESIA:  General endotracheal anesthesia.  INDICATIONS:  A 68 year old female with postmenopausal bleeding who underwent endometrial biopsy in the office that showed endometrial intraepithelial neoplasia, rule out adenocarcinoma.  DESCRIPTION OF PROCEDURE:  After adequate general endotracheal anesthesia, the patient was placed in dorsal supine position with the legs in the candy cane stirrups.  The patient's lower abdomen, perineum, and vagina were prepped and draped in normal  sterile fashion.  Timeout was performed.  Straight catheterization of the bladder yielded 250 mL of clear urine.  Speculum was placed into the vagina and the anterior cervix was grasped with a single-tooth tenaculum.  Likewise, the posterior cervical lip  was grasped with a single-tooth tenaculum.  An endocervical curettage was performed, followed by a gentle dilated to a #17 Hanks dilator.  The hysteroscope with lactated Ringer's as the distending medium was advanced into the endometrial cavity.  Fluffy  polypoid tissue was noted without definitive visualization of the fallopian tube ostia.  The hysteroscope was removed and the cervix was dilated to #17 Hanks dilator, followed by endometrial curettage.  Some polypoid fragments were returned.  Adequate  tissue was obtained.  Single-tooth tenaculum was removed.  Good hemostasis was noted.  COMPLICATIONS:  There were no complications.  ESTIMATED BLOOD LOSS:  Minimal.  INTRAOPERATIVE FLUIDS:  One liter.  URINE OUTPUT:  250 mL.  DISPOSITION:  The patient was taken to recovery room in good condition.  VN/NUANCE  D:06/14/2020 T:06/14/2020 JOB:[id number]

## 2020-06-14 NOTE — Anesthesia Postprocedure Evaluation (Signed)
Anesthesia Post Note  Patient: Amanda Watson  Procedure(s) Performed: FRACTIONAL DILATATION AND CURETTAGE /HYSTEROSCOPY (N/A )  Patient location during evaluation: PACU Anesthesia Type: General Level of consciousness: awake and alert Pain management: pain level controlled Vital Signs Assessment: post-procedure vital signs reviewed and stable Respiratory status: spontaneous breathing and respiratory function stable Cardiovascular status: stable Anesthetic complications: no   No complications documented.   Last Vitals:  Vitals:   06/14/20 1455 06/14/20 1505  BP: 134/60 (!) 143/55  Pulse: 70 76  Resp: 16 18  Temp:  (!) 36.3 C  SpO2: 97% 99%    Last Pain:  Vitals:   06/14/20 1505  TempSrc: Temporal  PainSc: 0-No pain                 [name] K

## 2020-06-15 ENCOUNTER — Encounter: Payer: Self-pay | Admitting: Obstetrics and Gynecology

## 2020-06-19 LAB — SURGICAL PATHOLOGY

## 2020-07-17 ENCOUNTER — Other Ambulatory Visit: Payer: Self-pay | Admitting: Obstetrics and Gynecology

## 2020-07-26 ENCOUNTER — Other Ambulatory Visit: Payer: Self-pay | Admitting: Family Medicine

## 2020-07-26 DIAGNOSIS — Z1231 Encounter for screening mammogram for malignant neoplasm of breast: Secondary | ICD-10-CM

## 2020-07-29 NOTE — H&P (Signed)
Ms. Amanda Watson is a 69 y.o. female here for Post Operative Visit . Pt is here for follow up  S/p Fractional D+C for EIN , r/o endometrial carcinoma Path:  DIAGNOSIS:  A. ENDOCERVIX; CURETTAGE:  - FRAGMENTS OF BENIGN ENDO AND ECTOCERVICAL TISSUE WITH ATROPHIC  CHANGES.  - NEGATIVE FOR SQUAMOUS INTRAEPITHELIAL LESION AND MALIGNANCY.   B. ENDOMETRIUM; CURETTAGE:  - FRAGMENTS OF ENDOMETRIAL POLYP WITH ASSOCIATED ENDOMETRIAL  INTRAEPITHELIAL NEOPLASIA (ATYPICAL HYPERPLASIA).  - NO EVIDENCE OF MALIGNANCY IN A SUPERFICIAL SAMPLING.   Past Medical History:  has a past medical history of Anemia, Asthma without status asthmaticus, unspecified, Chronic obstructive asthma, unspecified (CMS-HCC), Diabetes mellitus type 2, uncomplicated (CMS-HCC), Esophageal reflux, Essential hypertension, benign, GERD (gastroesophageal reflux disease), History of Bell's palsy (February 2011), History of chickenpox, Hypercholesterolemia, Hypersomnia with sleep apnea, unspecified, Multinodular goiter, Other and unspecified hyperlipidemia, Psoriasis, Pulmonary nodules, Sleep apnea, and Spinal stenosis.  Past Surgical History:  has a past surgical history that includes Cholecystectomy; Carpal tunnel repair (Left, December 2005); Carpal tunnel repair (Right, 2006); other surgery; Hernia repair (06/14/2015); Tubal ligation; Cesarean section; and Fractional D&C with hysteroscopy (06/14/2020). Family History: family history includes Heart failure in her mother; Myocardial Infarction (Heart attack) in her father. Social History:  reports that she quit smoking about 22 years ago. Her smoking use included cigarettes. She has a 10.00 pack-year smoking history. She has never used smokeless tobacco. She reports that she does not drink alcohol and does not use drugs. OB/GYN History:          OB History    Gravida  4   Para  3   Term      Preterm      AB  1   Living  2     SAB      TAB      Ectopic      Molar       Multiple      Live Births  2          Allergies: is allergic to levaquin [levofloxacin]. Medications:  Current Outpatient Medications:  .  ACCU-CHEK AVIVA CONTROL SOLN Soln, Use 1 each as directed, Disp: 1 each, Rfl: 2 .  ACCU-CHEK AVIVA PLUS METER Misc, 1 each by XX route as directed, Disp: 1 each, Rfl: 0 .  albuterol 90 mcg/actuation inhaler, Inhale 2 puffs using inhaler every four to six hours as needed [for SOB/wheeze], Disp: , Rfl:  .  aspirin (ASPIRIN LOW DOSE) 81 MG EC tablet, Take 81 mg by mouth once daily  , Disp: , Rfl:  .  azelastine (ASTELIN) 137 mcg nasal spray, Place 1 spray into both nostrils 2 (two) times daily, Disp: 30 mL, Rfl: 1 .  blood glucose diagnostic (ACCU-CHEK AVIVA PLUS TEST STRP) test strip, 1 each (1 strip total) by XX route 2 (two) times daily, Disp: 720 each, Rfl: 0 .  calcium carbonate-vitamin D3 (CALCIUM 600 + D,3,) 600 mg calcium- 200 unit Cap, Take 1 capsule by mouth once daily., Disp: , Rfl:  .  cyanocobalamin (VITAMIN B12) 1000 MCG tablet, Take 1,000 mcg by mouth once daily, Disp: , Rfl:  .  docusate (COLACE) 100 MG capsule, Take 100 mg by mouth 2 (two) times daily as needed., Disp: , Rfl:  .  ferrous sulfate 325 (65 FE) MG tablet, Take 325 mg by mouth every other day Takes 1-2 times a week due to constipation  , Disp: , Rfl:  .  fish oil-dha-epa 1,200-144-216 mg Cap, 1  capsule. At bedtime, Disp: , Rfl:  .  fluticasone propion-salmeteroL (ADVAIR DISKUS) 250-50 mcg/dose diskus inhaler, Inhale 1 inhalation into the lungs every 12 (twelve) hours, Disp: 3 Inhaler, Rfl: 3 .  gabapentin (NEURONTIN) 100 MG capsule, Take 1 capsule by mouth twice daily, Disp: 60 capsule, Rfl: 0 .  lancets, Use 1 each 2 (two) times daily, Disp: 720 each, Rfl: 0 .  magnesium oxide (MAG-OX) 400 mg tablet, Take 1 tablet by mouth once daily., Disp: , Rfl:  .  metFORMIN (GLUCOPHAGE) 1000 MG tablet, Take 1/2 (one-half) tablet by mouth twice daily, Disp: 90 tablet, Rfl: 0 .   montelukast (SINGULAIR) 10 mg tablet, Take 1 tablet by mouth in the evening, Disp: 90 tablet, Rfl: 2 .  niacin (NIASPAN) 500 MG ER tablet, Take 1 tablet by mouth once daily, Disp: 90 tablet, Rfl: 0 .  omeprazole (PRILOSEC) 20 MG DR capsule, Take 1 capsule by mouth once daily, Disp: 90 capsule, Rfl: 3 .  potassium gluconate 550 mg (90 mg) Tab, Take 1 tablet by mouth once daily., Disp: , Rfl:  .  simvastatin (ZOCOR) 40 MG tablet, Take 1 tablet by mouth in the evening, Disp: 90 tablet, Rfl: 0 .  telmisartan-hydrochlorothiazide (MICARDIS HCT) 40-12.5 mg tablet, Take 1 tablet by mouth once daily, Disp: 90 tablet, Rfl: 0 .  triamcinolone 0.1 % cream, Apply topically 2 (two) times daily, Disp: 30 g, Rfl: 0  Review of Systems: General:                      No fatigue or weight loss Eyes:                           No vision changes Ears:                            No hearing difficulty Respiratory:                No cough or shortness of breath Pulmonary:                  No asthma or shortness of breath Cardiovascular:           No chest pain, palpitations, dyspnea on exertion Gastrointestinal:          No abdominal bloating, chronic diarrhea, constipations, masses, pain or hematochezia Genitourinary:             No hematuria, dysuria, abnormal vaginal discharge, pelvic pain, Menometrorrhagia Lymphatic:                   No swollen lymph nodes Musculoskeletal:         No muscle weakness Neurologic:                  No extremity weakness, syncope, seizure disorder Psychiatric:                  No history of depression, delusions or suicidal/homicidal ideation    Exam:      Vitals:  07/30/20   BP: 126/75  Pulse: 69    Body mass index is 43.94 kg/m.  WDWN white/  female in NAD   Lungs: CTA  CV : RRR without murmur   Neck:  no thyromegaly Abdomen: soft , no mass, normal active bowel sounds,  non-tender, no rebound tenderness Pelvic: tanner stage 5 ,  External genitalia: vulva  /  labia no lesions Urethra: no prolapse Vagina: normal physiologic d/c, cx high . Limited room for TVH  Cervix: no lesions, no cervical motion tenderness   Uterus: normal size shape and contour, non-tender Adnexa: no mass,  non-tender   Rectovaginal:  Impression:   The encounter diagnosis was EIN (endometrial intraepithelial neoplasia).  Atypical on path review   Plan:  Explained the results and recommended tx  Recommend definitive tx with TAH / BSO . I have fully explained the procedure and post op recovery  preop by PCP before last surgery 1 month ago       No follow-ups on file.  Caroline Sauger, MD

## 2020-07-31 ENCOUNTER — Inpatient Hospital Stay: Admission: RE | Admit: 2020-07-31 | Payer: Medicare PPO | Source: Ambulatory Visit

## 2020-08-07 ENCOUNTER — Other Ambulatory Visit: Payer: Medicare PPO

## 2020-08-07 ENCOUNTER — Encounter
Admission: RE | Admit: 2020-08-07 | Discharge: 2020-08-07 | Disposition: A | Discharge status: Home / Self Care | Payer: Medicare PPO | Source: Ambulatory Visit | Attending: Obstetrics and Gynecology | Admitting: Obstetrics and Gynecology

## 2020-08-07 ENCOUNTER — Other Ambulatory Visit: Payer: Self-pay

## 2020-08-07 NOTE — Patient Instructions (Signed)
Your procedure is scheduled on: August 15, 2020 THURSDAY Report to Day Surgery on the 2nd floor of the [facility]. STOP AT REGISTRATION  BEFORE GOING UPSTAIRS To find out your arrival time, please call [phone] between 1PM - 3PM on: August 14, 2020 [facility]  REMEMBER: Instructions that are not followed completely may result in serious medical risk, up to and including death; or upon the discretion of your surgeon and anesthesiologist your surgery may need to be rescheduled.  Do not eat food after midnight the night before surgery.  No gum chewing, lozengers or hard candies.  You may however, drink CLEAR liquids up to 2 hours before you are scheduled to arrive for your surgery. Do not drink anything within 2 hours of your scheduled arrival time.  Clear liquids include: - water   Do NOT drink anything that is not on this list.  Type 1 and Type 2 diabetics should only drink water.  In addition, your doctor has ordered for you to drink the provided  Gatorade G2 Drinking this carbohydrate drink up to two hours before surgery helps to reduce insulin resistance and improve patient outcomes. Please complete drinking 2 hours prior to scheduled arrival time.  TAKE THESE MEDICATIONS THE MORNING OF SURGERY WITH A SIP OF WATER: allegra omeprazole (take one the night before and one on the morning of surgery - helps to prevent nausea after surgery.)  Use inhalers on the day of surgery  And nose spray  Stop Metformin  2 days prior to surgery. LAST DOSE 08/12/2020    One week prior to surgery: Stop Anti-inflammatories (NSAIDS) such as Advil, Aleve, Ibuprofen, Motrin, Naproxen, Naprosyn and ASPIRIN OR Aspirin based products such as Excedrin, Goodys Powder, BC Powder. ONLY TAKE TYLENOL   STOP OMEGA 3 FISH OIL  Stop ANY OVER THE COUNTER supplements until after surgery  (You may continue taking  Vitamin D, Vitamin B, and multivitamin AND MAGNESIUM.)  No Alcohol for 24 hours before or  after surgery.  No Smoking including e-cigarettes for 24 hours prior to surgery.  No chewable tobacco products for at least 6 hours prior to surgery.  No nicotine patches on the day of surgery.  Do not use any "recreational" drugs for at least a week prior to your surgery.  Please be advised that the combination of cocaine and anesthesia may have negative outcomes, up to and including death. If you test positive for cocaine, your surgery will be cancelled.  On the morning of surgery brush your teeth with toothpaste and water, you may rinse your mouth with mouthwash if you wish. Do not swallow any toothpaste or mouthwash.  Do not wear jewelry, make-up, hairpins, clips or nail polish.  Do not wear lotions, powders, or perfumes.   Do not shave 48 hours prior to surgery.   Contact lenses, hearing aids and dentures may not be worn into surgery.  [facility] is not responsible for any missing/lost belongings or valuables.   Use CHG Soap as directed on instruction sheet.  Bring your C-PAP to the hospital with you   Notify your doctor if there is any change in your medical condition (cold, fever, infection).  Wear comfortable clothing (specific to your surgery type) to the hospital.  Plan for stool softeners for home use; pain medications have a tendency to cause constipation. You can also help prevent constipation by eating foods high in fiber such as fruits and vegetables and drinking plenty of fluids as your diet allows.  After surgery,  you can help prevent lung complications by doing breathing exercises.  Take deep breaths and cough every 1-2 hours. Your doctor may order a device called an Incentive Spirometer to help you take deep breaths. When coughing or sneezing, hold a pillow firmly against your incision with both hands. This is called "splinting." Doing this helps protect your incision. It also decreases belly discomfort.  If you are being admitted to the hospital overnight,  [place].  If you are being discharged the day of surgery, you will not be allowed to drive home. You will need a responsible adult (18 years or older) to drive you home and stay with you that night.   Please call the [facility] at [phone] if you have any questions about these instructions.  Visitation Policy:  Patients undergoing a surgery or procedure may have one family member or support person with them as long as that person is not COVID-19 positive or experiencing its symptoms.  That person may remain in the waiting area during the procedure.  Inpatient Visitation Update:   In an effort to ensure the safety of our team members and our patients, we are implementing a change to our visitation policy:  Effective Monday, Aug. 9, at 7 a.m., inpatients will be allowed one support person.  o The support person may change daily.  o The support person must pass our screening, gel in and out, and wear a mask at all times, including in the patient's room.  o Patients must also wear a mask when staff or their support person are in the room.  o Masking is required regardless of vaccination status.  Systemwide, no visitors 17 or younger.

## 2020-08-13 ENCOUNTER — Other Ambulatory Visit: Payer: Self-pay

## 2020-08-13 ENCOUNTER — Other Ambulatory Visit
Admission: RE | Admit: 2020-08-13 | Discharge: 2020-08-13 | Disposition: A | Discharge status: Home / Self Care | Payer: Medicare PPO | Source: Ambulatory Visit | Attending: Obstetrics and Gynecology | Admitting: Obstetrics and Gynecology

## 2020-08-13 ENCOUNTER — Other Ambulatory Visit: Payer: Self-pay | Admitting: Obstetrics and Gynecology

## 2020-08-13 DIAGNOSIS — Z01818 Encounter for other preprocedural examination: Secondary | ICD-10-CM | POA: Insufficient documentation

## 2020-08-13 DIAGNOSIS — Z20822 Contact with and (suspected) exposure to covid-19: Secondary | ICD-10-CM | POA: Insufficient documentation

## 2020-08-13 LAB — BASIC METABOLIC PANEL
Anion gap: 8 (ref 5–15)
BUN: 13 mg/dL (ref 8–23)
CO2: 29 mmol/L (ref 22–32)
Calcium: 9.6 mg/dL (ref 8.9–10.3)
Chloride: 100 mmol/L (ref 98–111)
Creatinine, Ser: 1.12 mg/dL — ABNORMAL HIGH (ref 0.44–1.00)
GFR, Estimated: 53 mL/min — ABNORMAL LOW (ref >60)
Glucose, Bld: 145 mg/dL — ABNORMAL HIGH (ref 70–99)
Potassium: 3.6 mmol/L (ref 3.5–5.1)
Sodium: 137 mmol/L (ref 135–145)

## 2020-08-13 LAB — CBC
HCT: 40.7 % (ref 36.0–46.0)
Hemoglobin: 12.4 g/dL (ref 12.0–15.0)
MCH: 24.5 pg — ABNORMAL LOW (ref 26.0–34.0)
MCHC: 30.5 g/dL (ref 30.0–36.0)
MCV: 80.3 fL (ref 80.0–100.0)
Platelets: 291 10*3/uL (ref 150–400)
RBC: 5.07 MIL/uL (ref 3.87–5.11)
RDW: 16.9 % — ABNORMAL HIGH (ref 11.5–15.5)
WBC: 6.8 10*3/uL (ref 4.0–10.5)
nRBC: 0 % (ref 0.0–0.2)

## 2020-08-13 LAB — SARS CORONAVIRUS 2 (TAT 6-24 HRS): SARS Coronavirus 2: NEGATIVE

## 2020-08-13 LAB — TYPE AND SCREEN
ABO/RH(D): B POS
Antibody Screen: NEGATIVE

## 2020-08-15 ENCOUNTER — Inpatient Hospital Stay
Admission: RE | Admit: 2020-08-15 | Discharge: 2020-08-17 | DRG: 742 | Disposition: A | Discharge status: Home / Self Care | Payer: Medicare PPO | Attending: Obstetrics and Gynecology | Admitting: Obstetrics and Gynecology

## 2020-08-15 ENCOUNTER — Encounter: Payer: Self-pay | Admitting: Obstetrics and Gynecology

## 2020-08-15 ENCOUNTER — Encounter
Admission: RE | Disposition: A | Discharge status: Home / Self Care | Payer: Self-pay | Source: Home / Self Care | Attending: Obstetrics and Gynecology

## 2020-08-15 ENCOUNTER — Other Ambulatory Visit: Payer: Self-pay

## 2020-08-15 ENCOUNTER — Inpatient Hospital Stay: Payer: Medicare PPO | Admitting: Certified Registered"

## 2020-08-15 DIAGNOSIS — K219 Gastro-esophageal reflux disease without esophagitis: Secondary | ICD-10-CM | POA: Diagnosis present

## 2020-08-15 DIAGNOSIS — J449 Chronic obstructive pulmonary disease, unspecified: Secondary | ICD-10-CM | POA: Diagnosis present

## 2020-08-15 DIAGNOSIS — Z6841 Body Mass Index (BMI) 40.0 and over, adult: Secondary | ICD-10-CM | POA: Diagnosis not present

## 2020-08-15 DIAGNOSIS — Z881 Allergy status to other antibiotic agents status: Secondary | ICD-10-CM

## 2020-08-15 DIAGNOSIS — E119 Type 2 diabetes mellitus without complications: Secondary | ICD-10-CM | POA: Diagnosis present

## 2020-08-15 DIAGNOSIS — Z20822 Contact with and (suspected) exposure to covid-19: Secondary | ICD-10-CM | POA: Diagnosis present

## 2020-08-15 DIAGNOSIS — J45909 Unspecified asthma, uncomplicated: Secondary | ICD-10-CM | POA: Diagnosis present

## 2020-08-15 DIAGNOSIS — I10 Essential (primary) hypertension: Secondary | ICD-10-CM | POA: Diagnosis present

## 2020-08-15 DIAGNOSIS — Z9889 Other specified postprocedural states: Secondary | ICD-10-CM

## 2020-08-15 DIAGNOSIS — N8502 Endometrial intraepithelial neoplasia [EIN]: Principal | ICD-10-CM | POA: Diagnosis present

## 2020-08-15 DIAGNOSIS — Z7984 Long term (current) use of oral hypoglycemic drugs: Secondary | ICD-10-CM

## 2020-08-15 DIAGNOSIS — N950 Postmenopausal bleeding: Secondary | ICD-10-CM | POA: Diagnosis present

## 2020-08-15 DIAGNOSIS — Z87891 Personal history of nicotine dependence: Secondary | ICD-10-CM

## 2020-08-15 HISTORY — PX: HYSTERECTOMY ABDOMINAL WITH SALPINGECTOMY: SHX6725

## 2020-08-15 LAB — GLUCOSE, CAPILLARY
Glucose-Capillary: 151 mg/dL — ABNORMAL HIGH (ref 70–99)
Glucose-Capillary: 193 mg/dL — ABNORMAL HIGH (ref 70–99)
Glucose-Capillary: 178 mg/dL — ABNORMAL HIGH (ref 70–99)
Glucose-Capillary: 163 mg/dL — ABNORMAL HIGH (ref 70–99)
Glucose-Capillary: 124 mg/dL — ABNORMAL HIGH (ref 70–99)

## 2020-08-15 LAB — CBC
HCT: 40.2 % (ref 36.0–46.0)
Hemoglobin: 12.2 g/dL (ref 12.0–15.0)
MCH: 24.6 pg — ABNORMAL LOW (ref 26.0–34.0)
MCHC: 30.3 g/dL (ref 30.0–36.0)
MCV: 81 fL (ref 80.0–100.0)
Platelets: 275 10*3/uL (ref 150–400)
RBC: 4.96 MIL/uL (ref 3.87–5.11)
RDW: 17 % — ABNORMAL HIGH (ref 11.5–15.5)
WBC: 17.3 10*3/uL — ABNORMAL HIGH (ref 4.0–10.5)
nRBC: 0 % (ref 0.0–0.2)

## 2020-08-15 LAB — CREATININE, SERUM
Creatinine, Ser: 1.43 mg/dL — ABNORMAL HIGH (ref 0.44–1.00)
GFR, Estimated: 39 mL/min — ABNORMAL LOW (ref >60)

## 2020-08-15 LAB — ABO/RH: ABO/RH(D): B POS

## 2020-08-15 SURGERY — HYSTERECTOMY, TOTAL, ABDOMINAL, WITH SALPINGECTOMY
Anesthesia: General | Site: Abdomen | Laterality: Bilateral

## 2020-08-15 MED ORDER — ACETAMINOPHEN 500 MG PO TABS: 1000.0000 mg | ORAL_TABLET | ORAL | Status: AC

## 2020-08-15 MED ORDER — HYDROCHLOROTHIAZIDE 12.5 MG PO CAPS
12.5000 mg | ORAL_CAPSULE | Freq: Every day | ORAL | Status: DC
  Administered 2020-08-15 – 2020-08-17 (×3): 12.5 mg via ORAL
  Filled 2020-08-15 (×3): qty 1

## 2020-08-15 MED ORDER — SUCCINYLCHOLINE CHLORIDE 20 MG/ML IJ SOLN
INTRAMUSCULAR | Status: DC | PRN
  Administered 2020-08-15: 100 mg via INTRAVENOUS

## 2020-08-15 MED ORDER — IRBESARTAN 150 MG PO TABS
150.0000 mg | ORAL_TABLET | Freq: Every day | ORAL | Status: DC
  Administered 2020-08-15 – 2020-08-17 (×3): 150 mg via ORAL
  Filled 2020-08-15 (×3): qty 1

## 2020-08-15 MED ORDER — CEFAZOLIN SODIUM-DEXTROSE 2-4 GM/100ML-% IV SOLN
INTRAVENOUS | Status: AC
  Filled 2020-08-15: qty 100

## 2020-08-15 MED ORDER — DIPHENHYDRAMINE HCL 12.5 MG/5ML PO ELIX
12.5000 mg | ORAL_SOLUTION | Freq: Four times a day (QID) | ORAL | Status: DC | PRN
  Filled 2020-08-15: qty 5

## 2020-08-15 MED ORDER — ONDANSETRON HCL 4 MG/2ML IJ SOLN
INTRAMUSCULAR | Status: DC | PRN
  Administered 2020-08-15: 4 mg via INTRAVENOUS

## 2020-08-15 MED ORDER — DEXMEDETOMIDINE HCL IN NACL 400 MCG/100ML IV SOLN
INTRAVENOUS | Status: DC | PRN
  Administered 2020-08-15: 20 ug via INTRAVENOUS

## 2020-08-15 MED ORDER — STERILE WATER FOR IRRIGATION IR SOLN
Status: DC | PRN
  Administered 2020-08-15: 1000 mL

## 2020-08-15 MED ORDER — MEPERIDINE HCL 50 MG/ML IJ SOLN: 6.2500 mg | INTRAMUSCULAR | Status: DC | PRN

## 2020-08-15 MED ORDER — CHLORHEXIDINE GLUCONATE 0.12 % MT SOLN
OROMUCOSAL | Status: AC
  Administered 2020-08-15: 15 mL via OROMUCOSAL
  Filled 2020-08-15: qty 15

## 2020-08-15 MED ORDER — LACTATED RINGERS IV SOLN: INTRAVENOUS | Status: DC

## 2020-08-15 MED ORDER — HYDROCHLOROTHIAZIDE 12.5 MG PO CAPS
12.5000 mg | ORAL_CAPSULE | Freq: Every day | ORAL | Status: DC
  Filled 2020-08-15: qty 1

## 2020-08-15 MED ORDER — SODIUM CHLORIDE 0.9 % IV SOLN: INTRAVENOUS | Status: DC

## 2020-08-15 MED ORDER — MORPHINE SULFATE 2 MG/ML IV SOLN
INTRAVENOUS | Status: DC
  Filled 2020-08-15: qty 25

## 2020-08-15 MED ORDER — DEXMEDETOMIDINE (PRECEDEX) IN NS 20 MCG/5ML (4 MCG/ML) IV SYRINGE
PREFILLED_SYRINGE | INTRAVENOUS | Status: AC
  Filled 2020-08-15: qty 5

## 2020-08-15 MED ORDER — CEFAZOLIN SODIUM-DEXTROSE 2-4 GM/100ML-% IV SOLN
2.0000 g | Freq: Once | INTRAVENOUS | Status: AC
  Administered 2020-08-15: 2 g via INTRAVENOUS

## 2020-08-15 MED ORDER — ALBUTEROL SULFATE (2.5 MG/3ML) 0.083% IN NEBU: 3.0000 mL | INHALATION_SOLUTION | Freq: Four times a day (QID) | RESPIRATORY_TRACT | Status: DC | PRN

## 2020-08-15 MED ORDER — DEXAMETHASONE SODIUM PHOSPHATE 10 MG/ML IJ SOLN
INTRAMUSCULAR | Status: AC
  Filled 2020-08-15: qty 1

## 2020-08-15 MED ORDER — MIDAZOLAM HCL 2 MG/2ML IJ SOLN
INTRAMUSCULAR | Status: AC
  Filled 2020-08-15: qty 2

## 2020-08-15 MED ORDER — FENTANYL CITRATE (PF) 100 MCG/2ML IJ SOLN
INTRAMUSCULAR | Status: AC
  Filled 2020-08-15: qty 2

## 2020-08-15 MED ORDER — PROPOFOL 10 MG/ML IV BOLUS
INTRAVENOUS | Status: AC
  Filled 2020-08-15: qty 20

## 2020-08-15 MED ORDER — HYDROCODONE-ACETAMINOPHEN 7.5-325 MG PO TABS: 1.0000 | ORAL_TABLET | Freq: Once | ORAL | Status: DC | PRN

## 2020-08-15 MED ORDER — 0.9 % SODIUM CHLORIDE (POUR BTL) OPTIME
TOPICAL | Status: DC | PRN
  Administered 2020-08-15: 1000 mL

## 2020-08-15 MED ORDER — DEXAMETHASONE SODIUM PHOSPHATE 10 MG/ML IJ SOLN
INTRAMUSCULAR | Status: DC | PRN
  Administered 2020-08-15: 10 mg via INTRAVENOUS

## 2020-08-15 MED ORDER — CHLORHEXIDINE GLUCONATE 0.12 % MT SOLN: 15.0000 mL | Freq: Once | OROMUCOSAL | Status: AC

## 2020-08-15 MED ORDER — ONDANSETRON HCL 4 MG/2ML IJ SOLN
INTRAMUSCULAR | Status: AC
  Filled 2020-08-15: qty 2

## 2020-08-15 MED ORDER — SODIUM CHLORIDE 0.9 % IV SOLN
INTRAVENOUS | Status: DC | PRN
  Administered 2020-08-15: 70 mL

## 2020-08-15 MED ORDER — ACETAMINOPHEN 500 MG PO TABS
ORAL_TABLET | ORAL | Status: AC
  Administered 2020-08-15: 1000 mg via ORAL
  Filled 2020-08-15: qty 2

## 2020-08-15 MED ORDER — DIPHENHYDRAMINE HCL 50 MG/ML IJ SOLN: 12.5000 mg | Freq: Four times a day (QID) | INTRAMUSCULAR | Status: DC | PRN

## 2020-08-15 MED ORDER — ACETAMINOPHEN 500 MG PO TABS
1000.0000 mg | ORAL_TABLET | Freq: Four times a day (QID) | ORAL | Status: DC
  Administered 2020-08-15 – 2020-08-16 (×3): 1000 mg via ORAL
  Administered 2020-08-16: 500 mg via ORAL
  Administered 2020-08-16 – 2020-08-17 (×4): 1000 mg via ORAL
  Filled 2020-08-15 (×8): qty 2

## 2020-08-15 MED ORDER — ORAL CARE MOUTH RINSE: 15.0000 mL | Freq: Once | OROMUCOSAL | Status: AC

## 2020-08-15 MED ORDER — BUPIVACAINE HCL (PF) 0.5 % IJ SOLN
INTRAMUSCULAR | Status: AC
  Filled 2020-08-15: qty 30

## 2020-08-15 MED ORDER — LIDOCAINE HCL (PF) 2 % IJ SOLN
INTRAMUSCULAR | Status: AC
  Filled 2020-08-15: qty 5

## 2020-08-15 MED ORDER — ROCURONIUM BROMIDE 100 MG/10ML IV SOLN
INTRAVENOUS | Status: DC | PRN
  Administered 2020-08-15: 80 mg via INTRAVENOUS

## 2020-08-15 MED ORDER — FENTANYL CITRATE (PF) 100 MCG/2ML IJ SOLN
INTRAMUSCULAR | Status: DC | PRN
  Administered 2020-08-15: 100 ug via INTRAVENOUS
  Administered 2020-08-15: 50 ug via INTRAVENOUS
  Administered 2020-08-15: 100 ug via INTRAVENOUS

## 2020-08-15 MED ORDER — BUPIVACAINE LIPOSOME 1.3 % IJ SUSP
INTRAMUSCULAR | Status: AC
  Filled 2020-08-15: qty 20

## 2020-08-15 MED ORDER — PROMETHAZINE HCL 25 MG/ML IJ SOLN: 6.2500 mg | INTRAMUSCULAR | Status: DC | PRN

## 2020-08-15 MED ORDER — ONDANSETRON HCL 4 MG/2ML IJ SOLN: 4.0000 mg | Freq: Four times a day (QID) | INTRAMUSCULAR | Status: DC | PRN

## 2020-08-15 MED ORDER — KETOROLAC TROMETHAMINE 30 MG/ML IJ SOLN
INTRAMUSCULAR | Status: DC | PRN
  Administered 2020-08-15: 15 mg via INTRAVENOUS

## 2020-08-15 MED ORDER — DROPERIDOL 2.5 MG/ML IJ SOLN
0.6250 mg | Freq: Once | INTRAMUSCULAR | Status: DC | PRN
  Filled 2020-08-15: qty 2

## 2020-08-15 MED ORDER — GABAPENTIN 300 MG PO CAPS
ORAL_CAPSULE | ORAL | Status: AC
  Administered 2020-08-15: 300 mg via ORAL
  Filled 2020-08-15: qty 1

## 2020-08-15 MED ORDER — SODIUM CHLORIDE (PF) 0.9 % IJ SOLN
INTRAMUSCULAR | Status: AC
  Filled 2020-08-15: qty 50

## 2020-08-15 MED ORDER — KETOROLAC TROMETHAMINE 30 MG/ML IJ SOLN
INTRAMUSCULAR | Status: AC
  Filled 2020-08-15: qty 1

## 2020-08-15 MED ORDER — FENTANYL CITRATE (PF) 100 MCG/2ML IJ SOLN
25.0000 ug | INTRAMUSCULAR | Status: DC | PRN
  Administered 2020-08-15: 25 ug via INTRAVENOUS

## 2020-08-15 MED ORDER — PHENYLEPHRINE HCL (PRESSORS) 10 MG/ML IV SOLN
INTRAVENOUS | Status: DC | PRN
  Administered 2020-08-15 (×2): 200 ug via INTRAVENOUS

## 2020-08-15 MED ORDER — PROPOFOL 10 MG/ML IV BOLUS
INTRAVENOUS | Status: DC | PRN
  Administered 2020-08-15: 150 mg via INTRAVENOUS

## 2020-08-15 MED ORDER — LIDOCAINE HCL (CARDIAC) PF 100 MG/5ML IV SOSY
PREFILLED_SYRINGE | INTRAVENOUS | Status: DC | PRN
  Administered 2020-08-15: 50 mg via INTRAVENOUS

## 2020-08-15 MED ORDER — FENTANYL CITRATE (PF) 250 MCG/5ML IJ SOLN
INTRAMUSCULAR | Status: AC
  Filled 2020-08-15: qty 5

## 2020-08-15 MED ORDER — FAMOTIDINE IN NACL 20-0.9 MG/50ML-% IV SOLN
20.0000 mg | INTRAVENOUS | Status: DC
  Filled 2020-08-15 (×3): qty 50

## 2020-08-15 MED ORDER — SODIUM CHLORIDE 0.9% FLUSH: 9.0000 mL | INTRAVENOUS | Status: DC | PRN

## 2020-08-15 MED ORDER — ENOXAPARIN SODIUM 40 MG/0.4ML ~~LOC~~ SOLN
40.0000 mg | SUBCUTANEOUS | Status: DC
  Administered 2020-08-16 – 2020-08-17 (×2): 40 mg via SUBCUTANEOUS
  Filled 2020-08-15 (×2): qty 0.4

## 2020-08-15 MED ORDER — SUGAMMADEX SODIUM 500 MG/5ML IV SOLN
INTRAVENOUS | Status: DC | PRN
  Administered 2020-08-15: 200 mg via INTRAVENOUS

## 2020-08-15 MED ORDER — BUPIVACAINE HCL 0.5 % IJ SOLN
INTRAMUSCULAR | Status: DC | PRN
  Administered 2020-08-15: 30 mL

## 2020-08-15 MED ORDER — METFORMIN HCL 500 MG PO TABS
500.0000 mg | ORAL_TABLET | Freq: Once | ORAL | Status: AC
  Administered 2020-08-15: 500 mg via ORAL
  Filled 2020-08-15: qty 1

## 2020-08-15 MED ORDER — POVIDONE-IODINE 10 % EX SWAB
2.0000 "application " | Freq: Once | CUTANEOUS | Status: AC
  Administered 2020-08-15: 2 via TOPICAL

## 2020-08-15 MED ORDER — GABAPENTIN 300 MG PO CAPS
300.0000 mg | ORAL_CAPSULE | Freq: Every day | ORAL | Status: DC
  Administered 2020-08-16: 300 mg via ORAL
  Filled 2020-08-15: qty 1

## 2020-08-15 MED ORDER — IRBESARTAN 150 MG PO TABS: 150.0000 mg | ORAL_TABLET | Freq: Every day | ORAL | Status: DC

## 2020-08-15 MED ORDER — ONDANSETRON HCL 4 MG PO TABS: 4.0000 mg | ORAL_TABLET | Freq: Four times a day (QID) | ORAL | Status: DC | PRN

## 2020-08-15 MED ORDER — ROCURONIUM BROMIDE 10 MG/ML (PF) SYRINGE
PREFILLED_SYRINGE | INTRAVENOUS | Status: AC
  Filled 2020-08-15: qty 10

## 2020-08-15 MED ORDER — GABAPENTIN 300 MG PO CAPS: 300.0000 mg | ORAL_CAPSULE | ORAL | Status: AC

## 2020-08-15 MED ORDER — ACETAMINOPHEN 325 MG PO TABS: 325.0000 mg | ORAL_TABLET | ORAL | Status: DC | PRN

## 2020-08-15 MED ORDER — ACETAMINOPHEN 160 MG/5ML PO SOLN: 325.0000 mg | ORAL | Status: DC | PRN

## 2020-08-15 MED ORDER — NALOXONE HCL 0.4 MG/ML IJ SOLN: 0.4000 mg | INTRAMUSCULAR | Status: DC | PRN

## 2020-08-15 SURGICAL SUPPLY — 50 items
CANISTER SUCT 1200ML W/VALVE (MISCELLANEOUS) IMPLANT
CHLORAPREP W/TINT 26 (MISCELLANEOUS) ×3 IMPLANT
COUNTER NEEDLE 20/40 LG (NEEDLE) ×3 IMPLANT
COVER WAND RF STERILE (DRAPES) ×3 IMPLANT
DRAPE LAP W/FLUID (DRAPES) ×3 IMPLANT
DRAPE UNDER BUTTOCK W/FLU (DRAPES) ×3 IMPLANT
DRSG TELFA 3X8 NADH (GAUZE/BANDAGES/DRESSINGS) ×3 IMPLANT
ELECT BLADE 6.5 EXT (BLADE) ×3 IMPLANT
ELECT REM PT RETURN 9FT ADLT (ELECTROSURGICAL) ×3
ELECTRODE REM PT RTRN 9FT ADLT (ELECTROSURGICAL) ×1 IMPLANT
GAUZE 4X4 16PLY RFD (DISPOSABLE) ×3 IMPLANT
GAUZE SPONGE 4X4 12PLY STRL (GAUZE/BANDAGES/DRESSINGS) ×3 IMPLANT
GLOVE BIO SURGEON STRL SZ7 (GLOVE) ×3 IMPLANT
GLOVE BIOGEL M 6.5 STRL (GLOVE) ×3 IMPLANT
GLOVE BIOGEL M 7.0 STRL (GLOVE) ×3 IMPLANT
GLOVE BIOGEL M STER SZ 6 (GLOVE) ×3 IMPLANT
GLOVE BIOGEL PI IND STRL 6.5 (GLOVE) ×4 IMPLANT
GLOVE BIOGEL PI INDICATOR 6.5 (GLOVE) ×8
GLOVE SURG SYN 7.0 (GLOVE) ×3 IMPLANT
GLOVE SURG SYN 8.0 (GLOVE) ×3 IMPLANT
GOWN STRL REUS W/ TWL LRG LVL3 (GOWN DISPOSABLE) ×4 IMPLANT
GOWN STRL REUS W/ TWL XL LVL3 (GOWN DISPOSABLE) ×1 IMPLANT
GOWN STRL REUS W/TWL LRG LVL3 (GOWN DISPOSABLE) ×8
GOWN STRL REUS W/TWL XL LVL3 (GOWN DISPOSABLE) ×2
KIT TURNOVER CYSTO (KITS) ×3 IMPLANT
LABEL OR SOLS (LABEL) ×3 IMPLANT
MANIFOLD NEPTUNE II (INSTRUMENTS) ×3 IMPLANT
NEEDLE HYPO 22GX1.5 SAFETY (NEEDLE) ×6 IMPLANT
PACK BASIN MAJOR ARMC (MISCELLANEOUS) ×3 IMPLANT
PENCIL SMOKE ULTRAEVAC 22 CON (MISCELLANEOUS) IMPLANT
RETAINER VISCERA MED (MISCELLANEOUS) IMPLANT
SET CYSTO W/LG BORE CLAMP LF (SET/KITS/TRAYS/PACK) IMPLANT
SOL PREP PVP 2OZ (MISCELLANEOUS)
SOLUTION PREP PVP 2OZ (MISCELLANEOUS) IMPLANT
STAPLER INSORB 30 2030 C-SECTI (MISCELLANEOUS) ×3 IMPLANT
STAPLER SKIN PROX 35W (STAPLE) IMPLANT
SURGILUBE 2OZ TUBE FLIPTOP (MISCELLANEOUS) IMPLANT
SUT CHROMIC 2 0 CT 1 (SUTURE) ×6 IMPLANT
SUT PDS AB 1 TP1 96 (SUTURE) IMPLANT
SUT VIC AB 0 CT1 27 (SUTURE) ×4
SUT VIC AB 0 CT1 27XCR 8 STRN (SUTURE) ×2 IMPLANT
SUT VIC AB 0 CT1 36 (SUTURE) ×15 IMPLANT
SUT VIC AB 2-0 SH 27 (SUTURE) ×14
SUT VIC AB 2-0 SH 27XBRD (SUTURE) ×7 IMPLANT
SYR 20ML LL LF (SYRINGE) ×6 IMPLANT
SYR 30ML LL (SYRINGE) ×3 IMPLANT
SYR BULB IRRIG 60ML STRL (SYRINGE) ×3 IMPLANT
TAPE CLOTH SURG 4X10 WHT LF (GAUZE/BANDAGES/DRESSINGS) ×3 IMPLANT
TRAY FOLEY MTR SLVR 16FR STAT (SET/KITS/TRAYS/PACK) ×3 IMPLANT
WATER STERILE IRR 1000ML POUR (IV SOLUTION) ×3 IMPLANT

## 2020-08-15 NOTE — Anesthesia Preprocedure Evaluation (Addendum)
Anesthesia Evaluation  Patient identified by MRN, date of birth, ID band Patient awake    Reviewed: Allergy & Precautions, H&P , NPO status , reviewed documented beta blocker date and time   Airway Mallampati: III  TM Distance: <3 FB Neck ROM: full    Dental  (+) Caps   Pulmonary asthma , sleep apnea and Continuous Positive Airway Pressure Ventilation , former smoker,  Education done re OSA & GA   Pulmonary exam normal        Cardiovascular hypertension, Normal cardiovascular exam     Neuro/Psych    GI/Hepatic GERD  Medicated and Controlled,  Endo/Other  diabetesMorbid obesity  Renal/GU      Musculoskeletal   Abdominal   Peds  Hematology  (+) Blood dyscrasia, anemia ,   Anesthesia Other Findings Past Medical History: No date: Anemia No date: Asthma 1999: Cancer ([place])     Comment:  cervical ca No date: Diabetes mellitus without complication (HCC) No date: GERD (gastroesophageal reflux disease) No date: History of Bell's palsy No date: Hypercholesteremia No date: Hyperlipidemia No date: Hypertension No date: Multinodular goiter No date: Psoriasis No date: Sleep apnea     Comment:  uses CPAP No date: Spinal stenosis Past Surgical History: 03/22/2018: BROW LIFT; Bilateral     Comment:  Procedure: BLEPHAROPLASTY UPPER EYELID WITH EXCESS SKIN               DIABETIC;  Surgeon: Karle Starch, MD;  Location: [facility];  Service: Ophthalmology;  Laterality:               Bilateral;  DIABETIC-oral meds No date: CARPAL TUNNEL RELEASE; Bilateral 09/1981: CESAREAN SECTION 1983: CHOLECYSTECTOMY 2018: EYE SURGERY; Bilateral     Comment:  eyelid surgery 06/14/2020: HYSTEROSCOPY WITH D & C; N/A     Comment:  Procedure: [place]               /HYSTEROSCOPY;  Surgeon: Schermerhorn, Gwen Her, MD;                Location: [facility];  Service: Gynecology;  Laterality:                N/A; No date: JOINT REPLACEMENT; Left     Comment:  hip No date: TOTAL HIP ARTHROPLASTY; Left 05/05/2750: UMBILICAL HERNIA REPAIR; N/A     Comment:  Procedure: HERNIA REPAIR UMBILICAL ADULT;  Surgeon:               Leonie Green, MD;  Location: [facility];  Service:               General;  Laterality: N/A;   Reproductive/Obstetrics                             Anesthesia Physical Anesthesia Plan  ASA: III  Anesthesia Plan: General   Post-op Pain Management:    Induction: Intravenous  PONV Risk Score and Plan: 3 and Ondansetron, Midazolam, Diphenhydramine and Treatment may vary due to age or medical condition  Airway Management Planned: Oral ETT  Additional Equipment:   Intra-op Plan:   Post-operative Plan: Extubation in OR  Informed Consent: I have reviewed the patients History and Physical, chart, labs and discussed the procedure including the risks, benefits and alternatives for the proposed anesthesia with the patient or authorized representative who has  indicated his/her understanding and acceptance.     Dental Advisory Given  Plan Discussed with: CRNA  Anesthesia Plan Comments:         Anesthesia Quick Evaluation

## 2020-08-15 NOTE — Progress Notes (Signed)
Pt scheduled for a TAH / BSO for EIN atypical  All questions answered . LAbs reviewed . Ready to proceed .

## 2020-08-15 NOTE — Transfer of Care (Signed)
Immediate Anesthesia Transfer of Care Note  Patient: Amanda Watson  Procedure(s) Performed: HYSTERECTOMY ABDOMINAL WITH BILATERAL  SALPINGO OOPHORECTOMY (Bilateral Abdomen)  Patient Location: PACU  Anesthesia Type:General  Level of Consciousness: drowsy  Airway & Oxygen Therapy: Patient Spontanous Breathing and Patient connected to face mask oxygen  Post-op Assessment: Report given to RN and Post -op Vital signs reviewed and stable  Post vital signs: Reviewed and stable  Last Vitals:  Vitals Value Taken Time  BP 120/53 08/15/20 1414  Temp    Pulse 74 08/15/20 1416  Resp 23 08/15/20 1416  SpO2 100 % 08/15/20 1416  Vitals shown include unvalidated device data.  Last Pain:  Vitals:   08/15/20 0852  TempSrc: Temporal  PainSc: 0-No pain         Complications: No complications documented.

## 2020-08-15 NOTE — Brief Op Note (Signed)
08/15/2020  1:49 PM  PATIENT:  Sena Slate  69 y.o. female  PRE-OPERATIVE DIAGNOSIS:  atypical endometrial intraepithelial hypserplasia  POST-OPERATIVE DIAGNOSIS:  atypical endometrial intraepithelial hypserplasia  PROCEDURE:  Procedure(s): HYSTERECTOMY ABDOMINAL WITH BILATERAL  SALPINGO OOPHORECTOMY (Bilateral)  SURGEON:  Surgeon(s) and Role:    * Christin Moline, Gwen Her, MD - Primary    * Benjaman Kindler, MD - Assisting  PHYSICIAN ASSISTANT:   ASSISTANTS: cst   ANESTHESIA:   general  EBL:  150 mL iof 1200 cc  uo 300 cc  BLOOD ADMINISTERED:none  DRAINS: Urinary Catheter (Foley)   LOCAL MEDICATIONS USED:  MARCAINE    and BUPIVICAINE   SPECIMEN:  Source of Specimen:  cervix, uterus fallopian tubes and ovaries   DISPOSITION OF SPECIMEN:  PATHOLOGY  COUNTS:  YES  TOURNIQUET:  * No tourniquets in log *  DICTATION: .Other Dictation: Dictation Number verbal  PLAN OF CARE: Admit to inpatient   PATIENT DISPOSITION:  PACU - hemodynamically stable.   Delay start of Pharmacological VTE agent (>24hrs) due to surgical blood loss or risk of bleeding: not applicable

## 2020-08-15 NOTE — Anesthesia Procedure Notes (Signed)
Procedure Name: Intubation Performed by: Gaynelle Cage, CRNA Pre-anesthesia Checklist: Patient identified, Emergency Drugs available, Suction available and Patient being monitored Patient Re-evaluated:Patient Re-evaluated prior to induction Oxygen Delivery Method: Circle system utilized Preoxygenation: Pre-oxygenation with 100% oxygen Induction Type: IV induction Laryngoscope Size: McGraph and 3 Grade View: Grade II Tube type: Oral Tube size: 7.0 mm Number of attempts: 1 Airway Equipment and Method: Stylet Placement Confirmation: ETT inserted through vocal cords under direct vision,  positive ETCO2 and breath sounds checked- equal and bilateral Secured at: 21 cm Tube secured with: Tape Dental Injury: Teeth and Oropharynx as per pre-operative assessment

## 2020-08-15 NOTE — Anesthesia Postprocedure Evaluation (Signed)
Anesthesia Post Note  Patient: Amanda Watson  Procedure(s) Performed: HYSTERECTOMY ABDOMINAL WITH BILATERAL  SALPINGO OOPHORECTOMY (Bilateral Abdomen)  Patient location during evaluation: PACU Anesthesia Type: General Level of consciousness: awake and alert Pain management: pain level controlled Vital Signs Assessment: post-procedure vital signs reviewed and stable Respiratory status: spontaneous breathing, nonlabored ventilation and respiratory function stable Cardiovascular status: blood pressure returned to baseline and stable Postop Assessment: no apparent nausea or vomiting Anesthetic complications: no   No complications documented.   Last Vitals:  Vitals:   08/15/20 0852 08/15/20 1414  BP: (!) 153/66 (!) 120/53  Pulse: 90 75  Resp: 20 10  Temp: 36.7 C 36.4 C  SpO2: 98% 100%    Last Pain:  Vitals:   08/15/20 1414  TempSrc:   PainSc: Asleep                 Alphonsus Sias

## 2020-08-15 NOTE — Progress Notes (Signed)
Pt DOS . TAh  bso  Pain controlled on PCA . OU adequate . BP mildly elevated  VSS  Bp 150/69 A: Mild HTN  P: start BP meds tonight  Cont foley cath  Monitor for o2 desat given she uses a CPAP at home  If persisrtent desat may switch over to her [facility] in am  D/C foley at 0600 tomorrow

## 2020-08-16 ENCOUNTER — Encounter: Payer: Self-pay | Admitting: Obstetrics and Gynecology

## 2020-08-16 LAB — CBC
HCT: 35 % — ABNORMAL LOW (ref 36.0–46.0)
Hemoglobin: 10.6 g/dL — ABNORMAL LOW (ref 12.0–15.0)
MCH: 24.6 pg — ABNORMAL LOW (ref 26.0–34.0)
MCHC: 30.3 g/dL (ref 30.0–36.0)
MCV: 81.2 fL (ref 80.0–100.0)
Platelets: 248 10*3/uL (ref 150–400)
RBC: 4.31 MIL/uL (ref 3.87–5.11)
RDW: 17.2 % — ABNORMAL HIGH (ref 11.5–15.5)
WBC: 12.4 10*3/uL — ABNORMAL HIGH (ref 4.0–10.5)
nRBC: 0 % (ref 0.0–0.2)

## 2020-08-16 LAB — BASIC METABOLIC PANEL
Anion gap: 7 (ref 5–15)
BUN: 15 mg/dL (ref 8–23)
CO2: 29 mmol/L (ref 22–32)
Calcium: 8.8 mg/dL — ABNORMAL LOW (ref 8.9–10.3)
Chloride: 104 mmol/L (ref 98–111)
Creatinine, Ser: 1.37 mg/dL — ABNORMAL HIGH (ref 0.44–1.00)
GFR, Estimated: 41 mL/min — ABNORMAL LOW (ref >60)
Glucose, Bld: 122 mg/dL — ABNORMAL HIGH (ref 70–99)
Potassium: 4.3 mmol/L (ref 3.5–5.1)
Sodium: 140 mmol/L (ref 135–145)

## 2020-08-16 LAB — GLUCOSE, CAPILLARY
Glucose-Capillary: 130 mg/dL — ABNORMAL HIGH (ref 70–99)
Glucose-Capillary: 110 mg/dL — ABNORMAL HIGH (ref 70–99)
Glucose-Capillary: 81 mg/dL (ref 70–99)
Glucose-Capillary: 124 mg/dL — ABNORMAL HIGH (ref 70–99)
Glucose-Capillary: 151 mg/dL — ABNORMAL HIGH (ref 70–99)

## 2020-08-16 MED ORDER — OXYCODONE-ACETAMINOPHEN 5-325 MG PO TABS
1.0000 | ORAL_TABLET | ORAL | Status: DC | PRN
  Administered 2020-08-16: 1 via ORAL
  Filled 2020-08-16 (×2): qty 1

## 2020-08-16 MED ORDER — ONDANSETRON HCL 4 MG PO TABS: 8.0000 mg | ORAL_TABLET | Freq: Three times a day (TID) | ORAL | Status: DC | PRN

## 2020-08-16 MED ORDER — FAMOTIDINE 20 MG PO TABS
20.0000 mg | ORAL_TABLET | Freq: Every day | ORAL | Status: DC
  Administered 2020-08-16 – 2020-08-17 (×2): 20 mg via ORAL
  Filled 2020-08-16 (×2): qty 1

## 2020-08-16 MED ORDER — METFORMIN HCL 500 MG PO TABS
500.0000 mg | ORAL_TABLET | Freq: Two times a day (BID) | ORAL | Status: DC
  Administered 2020-08-16 – 2020-08-17 (×2): 500 mg via ORAL
  Filled 2020-08-16 (×4): qty 1

## 2020-08-16 NOTE — Progress Notes (Signed)
Subjective:POD#1 TAH / BSO  Patient reports no problems voiding.   Pain in good control on PCA Glucose levels good . No SOB  Objective: I have reviewed patient's vital signs, intake and output, medications and labs. Results for orders placed or performed during the hospital encounter of 08/15/20 (from the past 24 hour(s))  Glucose, capillary     Status: Abnormal   Collection Time: 08/15/20  2:17 PM  Result Value Ref Range   Glucose-Capillary 193 (H) 70 - 99 mg/dL  CBC     Status: Abnormal   Collection Time: 08/15/20  3:44 PM  Result Value Ref Range   WBC 17.3 (H) 4.0 - 10.5 K/uL   RBC 4.96 3.87 - 5.11 MIL/uL   Hemoglobin 12.2 12.0 - 15.0 g/dL   HCT 40.2 36 - 46 %   MCV 81.0 80.0 - 100.0 fL   MCH 24.6 (L) 26.0 - 34.0 pg   MCHC 30.3 30.0 - 36.0 g/dL   RDW 17.0 (H) 11.5 - 15.5 %   Platelets 275 150 - 400 K/uL   nRBC 0.0 0.0 - 0.2 %  Creatinine, serum     Status: Abnormal   Collection Time: 08/15/20  3:44 PM  Result Value Ref Range   Creatinine, Ser 1.43 (H) 0.44 - 1.00 mg/dL   GFR, Estimated 39 (L) >60 mL/min  Glucose, capillary     Status: Abnormal   Collection Time: 08/15/20  3:49 PM  Result Value Ref Range   Glucose-Capillary 178 (H) 70 - 99 mg/dL  Glucose, capillary     Status: Abnormal   Collection Time: 08/15/20  7:53 PM  Result Value Ref Range   Glucose-Capillary 163 (H) 70 - 99 mg/dL  Glucose, capillary     Status: Abnormal   Collection Time: 08/15/20 11:07 PM  Result Value Ref Range   Glucose-Capillary 124 (H) 70 - 99 mg/dL  Glucose, capillary     Status: Abnormal   Collection Time: 08/16/20  3:10 AM  Result Value Ref Range   Glucose-Capillary 130 (H) 70 - 99 mg/dL  CBC     Status: Abnormal   Collection Time: 08/16/20  7:07 AM  Result Value Ref Range   WBC 12.4 (H) 4.0 - 10.5 K/uL   RBC 4.31 3.87 - 5.11 MIL/uL   Hemoglobin 10.6 (L) 12.0 - 15.0 g/dL   HCT 35.0 (L) 36 - 46 %   MCV 81.2 80.0 - 100.0 fL   MCH 24.6 (L) 26.0 - 34.0 pg   MCHC 30.3 30.0 - 36.0  g/dL   RDW 17.2 (H) 11.5 - 15.5 %   Platelets 248 150 - 400 K/uL   nRBC 0.0 0.0 - 0.2 %  Basic metabolic panel     Status: Abnormal   Collection Time: 08/16/20  7:07 AM  Result Value Ref Range   Sodium 140 135 - 145 mmol/L   Potassium 4.3 3.5 - 5.1 mmol/L   Chloride 104 98 - 111 mmol/L   CO2 29 22 - 32 mmol/L   Glucose, Bld 122 (H) 70 - 99 mg/dL   BUN 15 8 - 23 mg/dL   Creatinine, Ser 1.37 (H) 0.44 - 1.00 mg/dL   Calcium 8.8 (L) 8.9 - 10.3 mg/dL   GFR, Estimated 41 (L) >60 mL/min   Anion gap 7 5 - 15  Glucose, capillary     Status: Abnormal   Collection Time: 08/16/20  8:19 AM  Result Value Ref Range   Glucose-Capillary 110 (H) 70 - 99 mg/dL  General: alert and cooperative Resp: clear to auscultation bilaterally Cardio: regular rate and rhythm, S1, S2 normal, no murmur, click, rub or gallop GI: soft, non-tender; bowel sounds normal; no masses,  no organomegaly Incision still covered   Assessment/Plan: Doing well  D/C PCA + IVF   saline lock Advance diet  PO meds, metformin 500 mg bid  Anticipate d/c tomorrow   LOS: 1 day    Amanda Watson 08/16/2020, 12:12 PM

## 2020-08-16 NOTE — Op Note (Signed)
NAMELISBET, BUSKER MEDICAL RECORD TZ:[id number] ACCOUNT [id number] DATE OF BIRTH:1951-08-19 FACILITY: [facility] LOCATION: [facility] PHYSICIAN:Joenathan Sakuma Josefine Class, MD  OPERATIVE REPORT  DATE OF PROCEDURE:  08/15/2020  PREOPERATIVE DIAGNOSIS:  Endometrial intraepithelial neoplasia, atypical.  POSTOPERATIVE DIAGNOSIS:  Endometrial intraepithelial neoplasia, atypical.  PROCEDURE:  Total abdominal hysterectomy, bilateral salpingo-oophorectomy.  ANESTHESIA:  General endotracheal anesthesia.  SURGEON:  Laverta Baltimore, MD  FIRST ASSISTANT:  Benjaman Kindler, MD  INDICATIONS:  A 69 year old female with postmenopausal bleeding, underwent fractional dilation and curettage that showed endometrial intraepithelial neoplasia with atypical features.  The patient's uterus was approximately 18 weeks in size.  DESCRIPTION OF PROCEDURE:  After adequate general endotracheal anesthesia, the patient was placed in dorsal supine position with the legs in the [place] stirrups.  The patient's abdomen, perineum and vagina were prepped and draped in normal sterile  fashion.  The patient did receive 2 grams IV Ancef prior to commencement of the case.  Timeout was performed.  A Pfannenstiel incision was made 2 fingerbreadths above the symphysis pubis.  Sharp dissection was used to identify the fascia.  Fascia was  opened in the midline and opened in a transverse fashion.  The superior aspect of the fascia was grasped with Kocher clamps and the recti muscles were dissected free.  The inferior aspect of the fascia was grasped with Kocher clamps and the pyramidalis  muscle was dissected free.  Entry into the peritoneal cavity was accomplished sharply.  O'Connor-O'Sullivan retractor was placed into the abdomen.  Given the large 18-week globular uterus that was filling the pelvis, double tooth tenaculum was used to  elevate the uterus throughout the wound.  Two large Kelly clamps were placed on the cornua.  The round  ligament on both sides were then clamped, transected, suture ligated with 0 Vicryl suture.  Anterior leaf of the broad ligament was incised along the  bladder reflection to the midline from both sides.  Bladder was gently dissected off the lower uterine segment with sharp and blunt dissection.  The infundibulopelvic ligament on the left side was doubly clamped, transected and suture ligated with 0  Vicryl suture.  The uterine artery on the left side was skeletonized and the uterine artery was then clamped, transected ands suture ligated with 0 Vicryl suture.  Given the limited space on the patient's right side, the round ligament was clamped,  transected and suture ligated followed by placement of the Heaney clamp at the cornua, incorporating the uteroovarian ligament.  The right ovary was left in situ until the end of the case to be removed.  The right uterine artery was skeletonized,  bilaterally clamped, transected and suture ligated with 0 Vicryl suture.  The uterus was then amputated above the uterine artery ligation.  The uterus was delivered off the operative field.  Cardinal ligaments were then clamped on both sides and suture  ligated with 0 Vicryl suture.  Ultimately, the clamps were placed at the vaginal cuff angles and the cervical stump was excised with Jorgenson scissors.  Vaginal cuff was then closed with interrupted 0 Vicryl sutures.  Good hemostasis was noted.  The  patient's right infundibulopelvic ligament was then clamped and the ovary and fallopian tube were removed on the right and the pedicle was doubly ligated with 0 Vicryl suture.  The patient's abdomen was copiously irrigated.  Good hemostasis was noted.   Of note, the case was technically difficult based on the patient's size of her uterus and her body habitus with excessive intra-abdominal fat.  All  pedicles were cut and the fascia was then closed with 0 Vicryl suture in a running nonlocking fashion, 2  separate sutures were used.   The fascial edges were then injected with a solution of 20 mL of 1.3% Exparel plus 30 mL of 0.5% Marcaine plus 50 mL normal saline.  50 mL of this solution was injected in the fascial edges.  Subcutaneous tissues were  irrigated and given the depth of the subcutaneous tissues of 6 cm, subcutaneous space was closed with a 2-0 chromic suture.  Skin was reapproximated with Insorb absorbable staples and additional 30 mL of Exparel solution was injected at the skin.  Foley  catheter remained in, yielded 300 mL of clear urine.  ESTIMATED BLOOD LOSS:  150 mL.  INTRAOPERATIVE FLUIDS:  1200 mL.  The patient did receive 15 mg intravenous Toradol at the end of the case.  COMPLICATIONS:  There were no complications.  The patient tolerated the procedure well.  HN/NUANCE  D:08/15/2020 T:08/16/2020 JOB:[id number]

## 2020-08-17 LAB — GLUCOSE, CAPILLARY
Glucose-Capillary: 114 mg/dL — ABNORMAL HIGH (ref 70–99)
Glucose-Capillary: 114 mg/dL — ABNORMAL HIGH (ref 70–99)
Glucose-Capillary: 116 mg/dL — ABNORMAL HIGH (ref 70–99)
Glucose-Capillary: 142 mg/dL — ABNORMAL HIGH (ref 70–99)

## 2020-08-17 MED ORDER — GABAPENTIN 300 MG PO CAPS: 300.0000 mg | ORAL_CAPSULE | Freq: Every day | ORAL | 2 refills | Status: DC

## 2020-08-17 MED ORDER — SIMETHICONE 80 MG PO CHEW: 80.0000 mg | CHEWABLE_TABLET | Freq: Four times a day (QID) | ORAL | 2 refills | Status: DC | PRN

## 2020-08-17 MED ORDER — IBUPROFEN 200 MG PO TABS: 400.0000 mg | ORAL_TABLET | Freq: Three times a day (TID) | ORAL | 2 refills | Status: AC | PRN

## 2020-08-17 MED ORDER — FLEET ENEMA 7-19 GM/118ML RE ENEM
1.0000 | ENEMA | Freq: Once | RECTAL | Status: AC
  Administered 2020-08-17: 1 via RECTAL

## 2020-08-17 MED ORDER — OXYCODONE-ACETAMINOPHEN 5-325 MG PO TABS
1.0000 | ORAL_TABLET | ORAL | 0 refills | Status: AC | PRN
Start: 2020-08-17 — End: 2021-08-17

## 2020-08-17 MED ORDER — ONDANSETRON HCL 8 MG PO TABS: 8.0000 mg | ORAL_TABLET | Freq: Three times a day (TID) | ORAL | 0 refills | Status: DC | PRN

## 2020-08-17 NOTE — Progress Notes (Signed)
NT reported that the pt stats went down to high 80's around 4am. RN reported to the room where the pt was asymptomatic and only required some deep breathing and coughing to bring her stats up in the high 90's.

## 2020-08-17 NOTE — Discharge Summary (Signed)
Physician Discharge Summary  Patient ID: Amanda Watson MRN: [id number] DOB/AGE: July 26, 1951 69 y.o.  Admit date: 08/15/2020 Discharge date: 08/17/2020  Admission Diagnoses: EIN and enlarge uterus   Discharge Diagnoses:  Active Problems:   Atypical endometrial hyperplasia   Post-operative state   Discharged Condition: good  Hospital Course: pt underwent a TAH / BSO .uncomplicated  POD#1 came off PCA and po meds were started . PAin in good control  Pod#2 on po meds . Ambulating . Incision healing nicely. Consults: None  Significant Diagnostic Studies: labs:  Results for orders placed or performed during the hospital encounter of 08/15/20 (from the past 72 hour(s))  Glucose, capillary     Status: Abnormal   Collection Time: 08/15/20  8:53 AM  Result Value Ref Range   Glucose-Capillary 151 (H) 70 - 99 mg/dL    Comment: Glucose reference range applies only to samples taken after fasting for at least 8 hours.  ABO/Rh     Status: None   Collection Time: 08/15/20  9:05 AM  Result Value Ref Range   ABO/RH(D)      B POS Performed at [facility], [address] [place], [place] [address]   Glucose, capillary     Status: Abnormal   Collection Time: 08/15/20  2:17 PM  Result Value Ref Range   Glucose-Capillary 193 (H) 70 - 99 mg/dL    Comment: Glucose reference range applies only to samples taken after fasting for at least 8 hours.  CBC     Status: Abnormal   Collection Time: 08/15/20  3:44 PM  Result Value Ref Range   WBC 17.3 (H) 4.0 - 10.5 K/uL   RBC 4.96 3.87 - 5.11 MIL/uL   Hemoglobin 12.2 12.0 - 15.0 g/dL   HCT 40.2 36 - 46 %   MCV 81.0 80.0 - 100.0 fL   MCH 24.6 (L) 26.0 - 34.0 pg   MCHC 30.3 30.0 - 36.0 g/dL   RDW 17.0 (H) 11.5 - 15.5 %   Platelets 275 150 - 400 K/uL   nRBC 0.0 0.0 - 0.2 %    Comment: Performed at [facility], [address] [place], [place] [address]  Creatinine, serum     Status: Abnormal   Collection Time: 08/15/20  3:44 PM   Result Value Ref Range   Creatinine, Ser 1.43 (H) 0.44 - 1.00 mg/dL   GFR, Estimated 39 (L) >60 mL/min    Comment: (NOTE) Calculated using the CKD-EPI Creatinine Equation (2021) Performed at [facility], [address] [place], [place] [address]   Glucose, capillary     Status: Abnormal   Collection Time: 08/15/20  3:49 PM  Result Value Ref Range   Glucose-Capillary 178 (H) 70 - 99 mg/dL    Comment: Glucose reference range applies only to samples taken after fasting for at least 8 hours.  Glucose, capillary     Status: Abnormal   Collection Time: 08/15/20  7:53 PM  Result Value Ref Range   Glucose-Capillary 163 (H) 70 - 99 mg/dL    Comment: Glucose reference range applies only to samples taken after fasting for at least 8 hours.  Glucose, capillary     Status: Abnormal   Collection Time: 08/15/20 11:07 PM  Result Value Ref Range   Glucose-Capillary 124 (H) 70 - 99 mg/dL    Comment: Glucose reference range applies only to samples taken after fasting for at least 8 hours.  Glucose, capillary     Status: Abnormal  Collection Time: 08/16/20  3:10 AM  Result Value Ref Range   Glucose-Capillary 130 (H) 70 - 99 mg/dL    Comment: Glucose reference range applies only to samples taken after fasting for at least 8 hours.  CBC     Status: Abnormal   Collection Time: 08/16/20  7:07 AM  Result Value Ref Range   WBC 12.4 (H) 4.0 - 10.5 K/uL   RBC 4.31 3.87 - 5.11 MIL/uL   Hemoglobin 10.6 (L) 12.0 - 15.0 g/dL   HCT 35.0 (L) 36 - 46 %   MCV 81.2 80.0 - 100.0 fL   MCH 24.6 (L) 26.0 - 34.0 pg   MCHC 30.3 30.0 - 36.0 g/dL   RDW 17.2 (H) 11.5 - 15.5 %   Platelets 248 150 - 400 K/uL   nRBC 0.0 0.0 - 0.2 %    Comment: Performed at [facility], [address] [place], [place] [address]  Basic metabolic panel     Status: Abnormal   Collection Time: 08/16/20  7:07 AM  Result Value Ref Range   Sodium 140 135 - 145 mmol/L   Potassium 4.3 3.5 - 5.1 mmol/L   Chloride 104  98 - 111 mmol/L   CO2 29 22 - 32 mmol/L   Glucose, Bld 122 (H) 70 - 99 mg/dL    Comment: Glucose reference range applies only to samples taken after fasting for at least 8 hours.   BUN 15 8 - 23 mg/dL   Creatinine, Ser 1.37 (H) 0.44 - 1.00 mg/dL   Calcium 8.8 (L) 8.9 - 10.3 mg/dL   GFR, Estimated 41 (L) >60 mL/min    Comment: (NOTE) Calculated using the CKD-EPI Creatinine Equation (2021)    Anion gap 7 5 - 15    Comment: Performed at [facility], [address] [place], [place] [address]  Glucose, capillary     Status: Abnormal   Collection Time: 08/16/20  8:19 AM  Result Value Ref Range   Glucose-Capillary 110 (H) 70 - 99 mg/dL    Comment: Glucose reference range applies only to samples taken after fasting for at least 8 hours.  Glucose, capillary     Status: None   Collection Time: 08/16/20 12:42 PM  Result Value Ref Range   Glucose-Capillary 81 70 - 99 mg/dL    Comment: Glucose reference range applies only to samples taken after fasting for at least 8 hours.  Glucose, capillary     Status: Abnormal   Collection Time: 08/16/20  5:21 PM  Result Value Ref Range   Glucose-Capillary 124 (H) 70 - 99 mg/dL    Comment: Glucose reference range applies only to samples taken after fasting for at least 8 hours.  Glucose, capillary     Status: Abnormal   Collection Time: 08/16/20  9:56 PM  Result Value Ref Range   Glucose-Capillary 151 (H) 70 - 99 mg/dL    Comment: Glucose reference range applies only to samples taken after fasting for at least 8 hours.  Glucose, capillary     Status: Abnormal   Collection Time: 08/17/20 12:14 AM  Result Value Ref Range   Glucose-Capillary 142 (H) 70 - 99 mg/dL    Comment: Glucose reference range applies only to samples taken after fasting for at least 8 hours.  Glucose, capillary     Status: Abnormal   Collection Time: 08/17/20  4:36 AM  Result Value Ref Range   Glucose-Capillary 114 (H) 70 - 99 mg/dL    Comment:  Glucose reference range  applies only to samples taken after fasting for at least 8 hours.  Glucose, capillary     Status: Abnormal   Collection Time: 08/17/20  8:02 AM  Result Value Ref Range   Glucose-Capillary 114 (H) 70 - 99 mg/dL    Comment: Glucose reference range applies only to samples taken after fasting for at least 8 hours.    Treatments: surgery: as above   Discharge Exam: Blood pressure (!) 150/69, pulse 73, temperature 99.2 F (37.3 C), temperature source Oral, resp. rate 16, height 5' (1.524 m), weight 99.8 kg, SpO2 92 %. General appearance: alert and cooperative Resp: clear to auscultation bilaterally Cardio: regular rate and rhythm, S1, S2 normal, no murmur, click, rub or gallop GI: soft, non-tender; bowel sounds normal; no masses,  no organomegaly Skin: Skin color, texture, turgor normal. No rashes or lesions or incision healing well  Disposition: home     Allergies as of 08/17/2020      Reactions   Levaquin [levofloxacin] Nausea And Vomiting      Medication List    STOP taking these medications   diclofenac Sodium 1 % Gel Commonly known as: Voltaren   ferrous sulfate 325 (65 FE) MG tablet   mometasone 50 MCG/ACT nasal spray Commonly known as: NASONEX   traMADol 50 MG tablet Commonly known as: Ultram     TAKE these medications   acetaminophen 500 MG tablet Commonly known as: TYLENOL Take 2 tablets (1,000 mg total) by mouth every 8 (eight) hours as needed for mild pain, moderate pain, fever or headache.   acidophilus Caps capsule Take 1 capsule by mouth daily.   albuterol 108 (90 Base) MCG/ACT inhaler Commonly known as: VENTOLIN HFA Inhale 1 puff into the lungs every 6 (six) hours as needed for wheezing or shortness of breath.   aspirin 81 MG tablet Take 81 mg by mouth daily.   Azelastine HCl 137 MCG/SPRAY Soln Place 1 spray into both nostrils daily as needed (allergies).   cholecalciferol 25 MCG (1000 UNIT) tablet Commonly known as: VITAMIN D3 Take 1,000 Units  by mouth daily.   docusate sodium 100 MG capsule Commonly known as: COLACE Take 200 mg by mouth 2 (two) times daily.   fexofenadine 180 MG tablet Commonly known as: ALLEGRA Take 180 mg by mouth daily.   Fish Oil 1000 MG Caps Take 2,000 mg by mouth daily.   Fluticasone-Salmeterol 250-50 MCG/DOSE Aepb Commonly known as: ADVAIR Inhale 1 puff into the lungs 2 (two) times daily as needed (shortness of breath).   gabapentin 300 MG capsule Commonly known as: Neurontin Take 1 capsule (300 mg total) by mouth at bedtime for 15 days. What changed:   medication strength  how much to take   ibuprofen 200 MG tablet Commonly known as: Motrin IB Take 2 tablets (400 mg total) by mouth every 8 (eight) hours as needed for moderate pain.   Magnesium 400 MG Tabs Take 400 mg by mouth daily.   metFORMIN 1000 MG tablet Commonly known as: GLUCOPHAGE Take 500 mg by mouth 2 (two) times daily with a meal.   montelukast 10 MG tablet Commonly known as: SINGULAIR Take 10 mg by mouth at bedtime.   niacin 500 MG CR tablet Commonly known as: NIASPAN Take 500 mg by mouth at bedtime.   omeprazole 20 MG capsule Commonly known as: PRILOSEC Take 20 mg by mouth daily.   ondansetron 8 MG tablet Commonly known as: Zofran Take 1 tablet (8 mg total) by  mouth every 8 (eight) hours as needed for nausea or vomiting.   oxyCODONE-acetaminophen 5-325 MG tablet Commonly known as: Percocet Take 1 tablet by mouth every 4 (four) hours as needed for moderate pain or severe pain.   potassium gluconate 595 (99 K) MG Tabs tablet Take 99 mg by mouth daily.   simethicone 80 MG chewable tablet Commonly known as: Gas-X Chew 1 tablet (80 mg total) by mouth 4 (four) times daily as needed for flatulence.   simvastatin 40 MG tablet Commonly known as: ZOCOR Take 40 mg by mouth every evening.   telmisartan-hydrochlorothiazide 40-12.5 MG tablet Commonly known as: MICARDIS HCT Take 0.5 tablets by mouth daily.    vitamin B-12 1000 MCG tablet Commonly known as: CYANOCOBALAMIN Take 1,000 mcg by mouth daily.       Follow-up Information    Valen Gillison, Gwen Her, MD Follow up in 2 week(s).   Specialty: Obstetrics and Gynecology Contact information: [address] [place] [address] [phone]               Signed: Gwen Her Brandell Maready 08/17/2020, 12:03 PM

## 2020-08-17 NOTE — Progress Notes (Signed)
Pt has done total of 4 laps around nursing station this shift.

## 2020-08-17 NOTE — Progress Notes (Signed)
Pt discharged home.  Discharge instructions, prescriptions and follow up appointment given to and reviewed with pt.  Pt verbalized understanding.  Escorted by auxillary. 

## 2020-08-17 NOTE — Discharge Instructions (Signed)
Abdominal Hysterectomy, Care After °This sheet gives you information about how to care for yourself after your procedure. Your health care provider may also give you more specific instructions. If you have problems or questions, contact your health care provider. °What can I expect after the procedure? °After your procedure, it is common to have: °· Pain. °· Fatigue. °· Poor appetite. °· Less interest in sex. °· Vaginal bleeding and discharge. You may need to use a sanitary napkin after this procedure. °Follow these instructions at home: °Bathing °· Do not take baths, swim, or use a hot tub until your health care provider approves. Ask your health care provider if you can take showers. You may only be allowed to take sponge baths for bathing. °· Keep the bandage (dressing) dry until your health care provider says it can be removed. °Incision care ° °· Follow instructions from your health care provider about how to take care of your incision. Make sure you: °? Wash your hands with soap and water before you change your bandage (dressing). If soap and water are not available, use hand sanitizer. °? Change your dressing as told by your health care provider. °? Leave stitches (sutures), skin glue, or adhesive strips in place. These skin closures may need to stay in place for 2 weeks or longer. If adhesive strip edges start to loosen and curl up, you may trim the loose edges. Do not remove adhesive strips completely unless your health care provider tells you to do that. °· Check your incision area every day for signs of infection. Check for: °? Redness, swelling, or pain. °? Fluid or blood. °? Warmth. °? Pus or a bad smell. °Activity °· Do gentle, daily exercises as told by your health care provider. You may be told to take short walks every day and go farther each time. °· Do not lift anything that is heavier than 10 lb (4.5 kg), or the limit that your health care provider tells you, until he or she says that it is  safe. °· Do not drive or use heavy machinery while taking prescription pain medicine. °· Do not drive for 24 hours if you were given a medicine to help you relax (sedative). °· Follow your health care provider's instructions about exercise, driving, and general activities. Ask your health care provider what activities are safe for you. °Lifestyle °· Do not douche, use tampons, or have sex for at least 6 weeks or as told by your health care provider. °· Do not drink alcohol until your health care provider approves. °· Drink enough fluid to keep your urine clear or pale yellow. °· Try to have someone at home with you for the first 1-2 weeks to help. °· Do not use any products that contain nicotine or tobacco, such as cigarettes and e-cigarettes. These can delay healing. If you need help quitting, ask your health care provider. °General instructions °· Take over-the-counter and prescription medicines only as told by your health care provider. °· Do not take aspirin or ibuprofen. These medicines can cause bleeding. °· To prevent or treat constipation while you are taking prescription pain medicine, your health care provider may recommend that you: °? Drink enough fluid to keep your urine clear or pale yellow. °? Take over-the-counter or prescription medicines. °? Eat foods that are high in fiber, such as fresh fruits and vegetables, whole grains, and beans. °? Limit foods that are high in fat and processed sugars, such as fried and sweet foods. °· Keep all   follow-up visits as told by your health care provider. This is important. Contact a health care provider if:  You have chills or fever.  You have redness, swelling, or pain around your incision.  You have fluid or blood coming from your incision.  Your incision feels warm to the touch.  You have pus or a bad smell coming from your incision.  Your incision breaks open.  You feel dizzy or light-headed.  You have pain or bleeding when you urinate.  You  have persistent diarrhea.  You have persistent nausea and vomiting.  You have abnormal vaginal discharge.  You have a rash.  You have any type of abnormal reaction or you develop an allergy to your medicine.  Your pain medicine does not help. Get help right away if:  You have a fever and your symptoms suddenly get worse.  You have severe abdominal pain.  You have shortness of breath.  You faint.  You have pain, swelling, or redness in your leg.  You have heavy vaginal bleeding with blood clots. Summary  After your procedure, it is common to have pain, fatigue and vaginal discharge.  Do not take baths, swim, or use a hot tub until your health care provider approves. Ask your health care provider if you can take showers. You may only be allowed to take sponge baths for bathing.  Follow your health care provider's instructions about exercise, driving, and general activities. Ask your health care provider what activities are safe for you.  Do not lift anything that is heavier than 10 lb (4.5 kg), or the limit that your health care provider tells you, until he or she says that it is safe.  Try to have someone at home with you for the first 1-2 weeks to help. This information is not intended to replace advice given to you by your health care provider. Make sure you discuss any questions you have with your health care provider. Document Revised: 10/25/2018 Document Reviewed: 09/09/2016 Elsevier Patient Education  [place].

## 2020-09-16 ENCOUNTER — Ambulatory Visit
Admission: RE | Admit: 2020-09-16 | Discharge: 2020-09-16 | Disposition: A | Discharge status: Home / Self Care | Payer: Medicare PPO | Source: Ambulatory Visit | Attending: Family Medicine | Admitting: Family Medicine

## 2020-09-16 ENCOUNTER — Other Ambulatory Visit: Payer: Self-pay

## 2020-09-16 DIAGNOSIS — Z1231 Encounter for screening mammogram for malignant neoplasm of breast: Secondary | ICD-10-CM | POA: Diagnosis not present

## 2020-09-20 LAB — SURGICAL PATHOLOGY

## 2020-12-13 ENCOUNTER — Other Ambulatory Visit
Admission: RE | Admit: 2020-12-13 | Discharge: 2020-12-13 | Disposition: A | Discharge status: Home / Self Care | Payer: Medicare PPO | Source: Ambulatory Visit | Attending: Gastroenterology | Admitting: Gastroenterology

## 2020-12-13 ENCOUNTER — Other Ambulatory Visit: Payer: Self-pay

## 2020-12-13 DIAGNOSIS — Z01812 Encounter for preprocedural laboratory examination: Secondary | ICD-10-CM | POA: Insufficient documentation

## 2020-12-13 DIAGNOSIS — Z20822 Contact with and (suspected) exposure to covid-19: Secondary | ICD-10-CM | POA: Diagnosis not present

## 2020-12-14 LAB — SARS CORONAVIRUS 2 (TAT 6-24 HRS): SARS Coronavirus 2: NEGATIVE

## 2020-12-17 ENCOUNTER — Ambulatory Visit: Payer: Medicare PPO | Admitting: Certified Registered"

## 2020-12-17 ENCOUNTER — Encounter
Admission: RE | Disposition: A | Discharge status: Home / Self Care | Payer: Self-pay | Source: Ambulatory Visit | Attending: Gastroenterology

## 2020-12-17 ENCOUNTER — Ambulatory Visit
Admission: RE | Admit: 2020-12-17 | Discharge: 2020-12-17 | Disposition: A | Discharge status: Home / Self Care | Payer: Medicare PPO | Source: Ambulatory Visit | Attending: Gastroenterology | Admitting: Gastroenterology

## 2020-12-17 ENCOUNTER — Other Ambulatory Visit: Payer: Self-pay

## 2020-12-17 DIAGNOSIS — Z7951 Long term (current) use of inhaled steroids: Secondary | ICD-10-CM | POA: Diagnosis not present

## 2020-12-17 DIAGNOSIS — E119 Type 2 diabetes mellitus without complications: Secondary | ICD-10-CM | POA: Insufficient documentation

## 2020-12-17 DIAGNOSIS — I10 Essential (primary) hypertension: Secondary | ICD-10-CM | POA: Insufficient documentation

## 2020-12-17 DIAGNOSIS — Z8541 Personal history of malignant neoplasm of cervix uteri: Secondary | ICD-10-CM | POA: Insufficient documentation

## 2020-12-17 DIAGNOSIS — Z7984 Long term (current) use of oral hypoglycemic drugs: Secondary | ICD-10-CM | POA: Insufficient documentation

## 2020-12-17 DIAGNOSIS — Z800 Family history of malignant neoplasm of digestive organs: Secondary | ICD-10-CM | POA: Insufficient documentation

## 2020-12-17 DIAGNOSIS — Z7982 Long term (current) use of aspirin: Secondary | ICD-10-CM | POA: Diagnosis not present

## 2020-12-17 DIAGNOSIS — J449 Chronic obstructive pulmonary disease, unspecified: Secondary | ICD-10-CM | POA: Insufficient documentation

## 2020-12-17 DIAGNOSIS — Z79899 Other long term (current) drug therapy: Secondary | ICD-10-CM | POA: Insufficient documentation

## 2020-12-17 DIAGNOSIS — K5730 Diverticulosis of large intestine without perforation or abscess without bleeding: Secondary | ICD-10-CM | POA: Insufficient documentation

## 2020-12-17 DIAGNOSIS — Z86010 Personal history of colonic polyps: Secondary | ICD-10-CM | POA: Diagnosis not present

## 2020-12-17 DIAGNOSIS — Z1211 Encounter for screening for malignant neoplasm of colon: Secondary | ICD-10-CM | POA: Diagnosis present

## 2020-12-17 DIAGNOSIS — Z881 Allergy status to other antibiotic agents status: Secondary | ICD-10-CM | POA: Insufficient documentation

## 2020-12-17 DIAGNOSIS — K640 First degree hemorrhoids: Secondary | ICD-10-CM | POA: Diagnosis not present

## 2020-12-17 HISTORY — PX: COLONOSCOPY WITH PROPOFOL: SHX5780

## 2020-12-17 LAB — GLUCOSE, CAPILLARY: Glucose-Capillary: 115 mg/dL — ABNORMAL HIGH (ref 70–99)

## 2020-12-17 SURGERY — COLONOSCOPY WITH PROPOFOL
Anesthesia: General

## 2020-12-17 MED ORDER — PROPOFOL 10 MG/ML IV BOLUS
INTRAVENOUS | Status: DC | PRN
  Administered 2020-12-17: 80 mg via INTRAVENOUS

## 2020-12-17 MED ORDER — PROPOFOL 10 MG/ML IV BOLUS
INTRAVENOUS | Status: AC
  Filled 2020-12-17: qty 20

## 2020-12-17 MED ORDER — PROPOFOL 500 MG/50ML IV EMUL
INTRAVENOUS | Status: DC | PRN
  Administered 2020-12-17: 150 ug/kg/min via INTRAVENOUS

## 2020-12-17 MED ORDER — PROPOFOL 500 MG/50ML IV EMUL
INTRAVENOUS | Status: AC
  Filled 2020-12-17: qty 50

## 2020-12-17 MED ORDER — SODIUM CHLORIDE 0.9 % IV SOLN: INTRAVENOUS | Status: DC

## 2020-12-17 NOTE — Interval H&P Note (Signed)
History and Physical Interval Note:  12/17/2020 9:06 AM  Amanda Watson  has presented today for surgery, with the diagnosis of Family hx of colon cancer.  The various methods of treatment have been discussed with the patient and family. After consideration of risks, benefits and other options for treatment, the patient has consented to  Procedure(s): COLONOSCOPY WITH PROPOFOL (N/A) as a surgical intervention.  The patient's history has been reviewed, patient examined, no change in status, stable for surgery.  I have reviewed the patient's chart and labs.  Questions were answered to the patient's satisfaction.     Lesly Rubenstein  Ok to proceed with colonoscopy

## 2020-12-17 NOTE — Anesthesia Postprocedure Evaluation (Signed)
Anesthesia Post Note  Patient: Sena Slate  Procedure(s) Performed: COLONOSCOPY WITH PROPOFOL (N/A )  Patient location during evaluation: Endoscopy Anesthesia Type: General Level of consciousness: awake and alert Pain management: pain level controlled Vital Signs Assessment: post-procedure vital signs reviewed and stable Respiratory status: spontaneous breathing, nonlabored ventilation, respiratory function stable and patient connected to nasal cannula oxygen Cardiovascular status: blood pressure returned to baseline and stable Postop Assessment: no apparent nausea or vomiting Anesthetic complications: no   No complications documented.   Last Vitals:  Vitals:   12/17/20 0939 12/17/20 0949  BP: (!) 72/44 (!) 104/55  Pulse: 79 74  Resp: 18 17  Temp:    SpO2: 90% 100%    Last Pain:  Vitals:   12/17/20 0959  TempSrc:   PainSc: 0-No pain                 Precious Haws Mishael Krysiak

## 2020-12-17 NOTE — Transfer of Care (Signed)
Immediate Anesthesia Transfer of Care Note  Patient: Amanda Watson  Procedure(s) Performed: COLONOSCOPY WITH PROPOFOL (N/A )  Patient Location: PACU and Endoscopy Unit  Anesthesia Type:General  Level of Consciousness: drowsy  Airway & Oxygen Therapy: Patient Spontanous Breathing  Post-op Assessment: Report given to RN  Post vital signs: stable  Last Vitals:  Vitals Value Taken Time  BP    Temp    Pulse    Resp    SpO2      Last Pain:  Vitals:   12/17/20 0805  TempSrc: Temporal  PainSc: 0-No pain         Complications: No complications documented.

## 2020-12-17 NOTE — Op Note (Signed)
[facility] Gastroenterology Patient Name: Amanda Watson Procedure Date: 12/17/2020 9:01 AM MRN: [id number] Account #: [id number] Date of Birth: July 29, 1951 Admit Type: Outpatient Age: 70 Room: [facility] ENDO ROOM 1 Gender: Female Note Status: Finalized Procedure:             Colonoscopy Indications:           High risk colon cancer surveillance: Personal history                         of colonic polyps, Family history of colon cancer in a                         first-degree relative before age 64 years Providers:             Andrey Farmer MD, MD Medicines:             Monitored Anesthesia Care Complications:         No immediate complications. Procedure:             Pre-Anesthesia Assessment:                        - Prior to the procedure, a History and Physical was                         performed, and patient medications and allergies were                         reviewed. The patient is competent. The risks and                         benefits of the procedure and the sedation options and                         risks were discussed with the patient. All questions                         were answered and informed consent was obtained.                         Patient identification and proposed procedure were                         verified by the physician, the nurse, the anesthetist                         and the technician in the endoscopy suite. Mental                         Status Examination: alert and oriented. Airway                         Examination: normal oropharyngeal airway and neck                         mobility. Respiratory Examination: clear to                         auscultation. CV Examination: normal. Prophylactic  Antibiotics: The patient does not require prophylactic                         antibiotics. Prior Anticoagulants: The patient has                         taken no previous anticoagulant or antiplatelet                          agents. ASA Grade Assessment: III - A patient with                         severe systemic disease. After reviewing the risks and                         benefits, the patient was deemed in satisfactory                         condition to undergo the procedure. The anesthesia                         plan was to use monitored anesthesia care (MAC).                         Immediately prior to administration of medications,                         the patient was re-assessed for adequacy to receive                         sedatives. The heart rate, respiratory rate, oxygen                         saturations, blood pressure, adequacy of pulmonary                         ventilation, and response to care were monitored                         throughout the procedure. The physical status of the                         patient was re-assessed after the procedure.                        After obtaining informed consent, the colonoscope was                         passed under direct vision. Throughout the procedure,                         the patient's blood pressure, pulse, and oxygen                         saturations were monitored continuously. The                         Colonoscope was introduced through the anus and  advanced to the the cecum, identified by appendiceal                         orifice and ileocecal valve. The colonoscopy was                         technically difficult and complex due to significant                         looping. Successful completion of the procedure was                         aided by applying abdominal pressure. The patient                         tolerated the procedure well. The quality of the bowel                         preparation was good. Findings:      The perianal and digital rectal examinations were normal.      A few small-mouthed diverticula were found in the sigmoid colon.       Internal hemorrhoids were found during retroflexion. The hemorrhoids       were Grade I (internal hemorrhoids that do not prolapse).      The exam was otherwise without abnormality on direct and retroflexion       views. Impression:            - Diverticulosis in the sigmoid colon.                        - Internal hemorrhoids.                        - The examination was otherwise normal on direct and                         retroflexion views.                        - No specimens collected. Recommendation:        - Discharge patient to home.                        - Resume previous diet.                        - Continue present medications.                        - Repeat colonoscopy in 5 years for surveillance.                        - Return to referring physician as previously                         scheduled. Procedure Code(s):     --- Professional ---                        H7342, Colorectal cancer screening; colonoscopy on  individual at high risk Diagnosis Code(s):     --- Professional ---                        Z86.010, Personal history of colonic polyps                        K64.0, First degree hemorrhoids                        Z80.0, Family history of malignant neoplasm of                         digestive organs                        K57.30, Diverticulosis of large intestine without                         perforation or abscess without bleeding CPT copyright 2019 [employer]. All rights reserved. The codes documented in this report are preliminary and upon coder review may  be revised to meet current compliance requirements. Andrey Farmer MD, MD 12/17/2020 9:37:25 AM Number of Addenda: 0 Note Initiated On: 12/17/2020 9:01 AM Scope Withdrawal Time: 0 hours 10 minutes 32 seconds  Total Procedure Duration: 0 hours 20 minutes 21 seconds  Estimated Blood Loss:  Estimated blood loss: none.      [facility]

## 2020-12-17 NOTE — H&P (Signed)
Outpatient short stay form Pre-procedure 12/17/2020 9:04 AM Amanda Miyamoto MD, MPH  Primary Physician: Dr. Lovie Macadamia  Reason for visit:  Family history of colon cancer  History of present illness:   70 y/o lady with multiple medical problems including COPD, DM II, hypertension, and constipation here for colonoscopy. Says she has had polyps before. History of hysterectomy. Brother with colon cancer in his 5's. No significant GI symptoms.    Current Facility-Administered Medications:  .  0.9 %  sodium chloride infusion, , Intravenous, Continuous, Aldene Hendon, Hilton Cork, MD, Last Rate: 20 mL/hr at 12/17/20 0830, New Bag at 12/17/20 0830  Medications Prior to Admission  Medication Sig Dispense Refill Last Dose  . acetaminophen (TYLENOL) 500 MG tablet Take 2 tablets (1,000 mg total) by mouth every 8 (eight) hours as needed for mild pain, moderate pain, fever or headache. 50 tablet 0 Past Week at Unknown time  . albuterol (PROVENTIL HFA;VENTOLIN HFA) 108 (90 BASE) MCG/ACT inhaler Inhale 1 puff into the lungs every 6 (six) hours as needed for wheezing or shortness of breath.   Past Week at Unknown time  . Azelastine HCl 137 MCG/SPRAY SOLN Place 1 spray into both nostrils daily as needed (allergies).    Past Week at Unknown time  . docusate sodium (COLACE) 100 MG capsule Take 200 mg by mouth 2 (two) times daily.    12/16/2020 at Unknown time  . fexofenadine (ALLEGRA) 180 MG tablet Take 180 mg by mouth daily.   Past Week at Unknown time  . Fluticasone-Salmeterol (ADVAIR) 250-50 MCG/DOSE AEPB Inhale 1 puff into the lungs 2 (two) times daily as needed (shortness of breath).    Past Week at Unknown time  . ibuprofen (MOTRIN IB) 200 MG tablet Take 2 tablets (400 mg total) by mouth every 8 (eight) hours as needed for moderate pain. 50 tablet 2 Past Month at Unknown time  . metFORMIN (GLUCOPHAGE) 1000 MG tablet Take 500 mg by mouth 2 (two) times daily with a meal.    12/16/2020 at Unknown time  . montelukast  (SINGULAIR) 10 MG tablet Take 10 mg by mouth at bedtime.   12/16/2020 at Unknown time  . niacin (NIASPAN) 500 MG CR tablet Take 500 mg by mouth at bedtime.    12/16/2020 at Unknown time  . Omega-3 Fatty Acids (FISH OIL) 1000 MG CAPS Take 2,000 mg by mouth daily.    Past Week at Unknown time  . ondansetron (ZOFRAN) 8 MG tablet Take 1 tablet (8 mg total) by mouth every 8 (eight) hours as needed for nausea or vomiting. 20 tablet 0 Past Month at Unknown time  . simethicone (GAS-X) 80 MG chewable tablet Chew 1 tablet (80 mg total) by mouth 4 (four) times daily as needed for flatulence. 100 tablet 2 12/16/2020 at Unknown time  . simvastatin (ZOCOR) 40 MG tablet Take 40 mg by mouth every evening.   12/16/2020 at Unknown time  . telmisartan-hydrochlorothiazide (MICARDIS HCT) 40-12.5 MG per tablet Take 0.5 tablets by mouth daily.    12/16/2020 at Unknown time  . acidophilus (RISAQUAD) CAPS capsule Take 1 capsule by mouth daily.  (Patient not taking: Reported on 12/17/2020)   Not Taking at Unknown time  . aspirin 81 MG tablet Take 81 mg by mouth daily.    12/15/2020  . cholecalciferol (VITAMIN D3) 25 MCG (1000 UNIT) tablet Take 1,000 Units by mouth daily.   12/15/2020  . gabapentin (NEURONTIN) 300 MG capsule Take 1 capsule (300 mg total) by mouth at bedtime for  15 days. 15 capsule 2   . Magnesium 400 MG TABS Take 400 mg by mouth daily.    12/15/2020  . omeprazole (PRILOSEC) 20 MG capsule Take 20 mg by mouth daily.   12/15/2020  . oxyCODONE-acetaminophen (PERCOCET) 5-325 MG tablet Take 1 tablet by mouth every 4 (four) hours as needed for moderate pain or severe pain. (Patient not taking: Reported on 12/17/2020) 20 tablet 0 Not Taking at Unknown time  . potassium gluconate 595 (99 K) MG TABS tablet Take 99 mg by mouth daily.    12/15/2020  . vitamin B-12 (CYANOCOBALAMIN) 1000 MCG tablet Take 1,000 mcg by mouth daily.   12/15/2020     Allergies  Allergen Reactions  . Levaquin [Levofloxacin] Nausea And Vomiting      Past Medical History:  Diagnosis Date  . Anemia   . Asthma   . Cancer ([place]) 1999   cervical ca  . Diabetes mellitus without complication ([place])   . GERD (gastroesophageal reflux disease)   . History of Bell's palsy   . Hypercholesteremia   . Hyperlipidemia   . Hypertension   . Multinodular goiter   . Psoriasis   . Sleep apnea    uses CPAP  . Spinal stenosis     Review of systems:  Otherwise negative.    Physical Exam  Gen: Alert, oriented. Appears stated age.  HEENT: PERRLA. Lungs: No respiratory distress CV: RRR Abd: soft, benign, no masses Ext: No edema    Planned procedures: Proceed with colonoscopy. The patient understands the nature of the planned procedure, indications, risks, alternatives and potential complications including but not limited to bleeding, infection, perforation, damage to internal organs and possible oversedation/side effects from anesthesia. The patient agrees and gives consent to proceed.  Please refer to procedure notes for findings, recommendations and patient disposition/instructions.     Amanda Miyamoto MD, MPH Gastroenterology 12/17/2020  9:04 AM

## 2020-12-17 NOTE — Anesthesia Preprocedure Evaluation (Signed)
Anesthesia Evaluation  Patient identified by MRN, date of birth, ID band Patient awake    Reviewed: Allergy & Precautions, H&P , NPO status , Patient's Chart, lab work & pertinent test results  History of Anesthesia Complications Negative for: history of anesthetic complications  Airway Mallampati: III  TM Distance: <3 FB Neck ROM: full    Dental  (+) Chipped   Pulmonary asthma , sleep apnea , former smoker,    Pulmonary exam normal        Cardiovascular hypertension, (-) anginaNormal cardiovascular exam     Neuro/Psych negative neurological ROS  negative psych ROS   GI/Hepatic Neg liver ROS, GERD  Medicated and Controlled,  Endo/Other  diabetes, Type 2  Renal/GU negative Renal ROS  negative genitourinary   Musculoskeletal   Abdominal   Peds  Hematology negative hematology ROS (+)   Anesthesia Other Findings Past Medical History: No date: Anemia No date: Asthma 1999: Cancer ([place])     Comment:  cervical ca No date: Diabetes mellitus without complication (HCC) No date: GERD (gastroesophageal reflux disease) No date: History of Bell's palsy No date: Hypercholesteremia No date: Hyperlipidemia No date: Hypertension No date: Multinodular goiter No date: Psoriasis No date: Sleep apnea     Comment:  uses CPAP No date: Spinal stenosis  Past Surgical History: 03/22/2018: BROW LIFT; Bilateral     Comment:  Procedure: BLEPHAROPLASTY UPPER EYELID WITH EXCESS SKIN               DIABETIC;  Surgeon: Karle Starch, MD;  Location: [place];  Service: Ophthalmology;  Laterality:               Bilateral;  DIABETIC-oral meds No date: CARPAL TUNNEL RELEASE; Bilateral 09/1981: CESAREAN SECTION 1983: CHOLECYSTECTOMY 2018: EYE SURGERY; Bilateral     Comment:  eyelid surgery 08/15/2020: HYSTERECTOMY ABDOMINAL WITH SALPINGECTOMY; Bilateral     Comment:  Procedure: HYSTERECTOMY ABDOMINAL WITH  BILATERAL                SALPINGO OOPHORECTOMY;  Surgeon: Schermerhorn, Gwen Her,               MD;  Location: [facility];  Service: Gynecology;                Laterality: Bilateral; 06/14/2020: HYSTEROSCOPY WITH D & C; N/A     Comment:  Procedure: [place]               /HYSTEROSCOPY;  Surgeon: Schermerhorn, Gwen Her, MD;                Location: [facility];  Service: Gynecology;  Laterality:               N/A; No date: JOINT REPLACEMENT; Left     Comment:  hip No date: TOTAL HIP ARTHROPLASTY; Left 06/14/3715: UMBILICAL HERNIA REPAIR; N/A     Comment:  Procedure: HERNIA REPAIR UMBILICAL ADULT;  Surgeon:               Leonie Green, MD;  Location: [facility];  Service:               General;  Laterality: N/A;  BMI    Body Mass Index: 41.99 kg/m      Reproductive/Obstetrics negative OB ROS  Anesthesia Physical Anesthesia Plan  ASA: III  Anesthesia Plan: General   Post-op Pain Management:    Induction: Intravenous  PONV Risk Score and Plan: Propofol infusion and TIVA  Airway Management Planned: Natural Airway and Nasal Cannula  Additional Equipment:   Intra-op Plan:   Post-operative Plan:   Informed Consent: I have reviewed the patients History and Physical, chart, labs and discussed the procedure including the risks, benefits and alternatives for the proposed anesthesia with the patient or authorized representative who has indicated his/her understanding and acceptance.     Dental Advisory Given  Plan Discussed with: Anesthesiologist, CRNA and Surgeon  Anesthesia Plan Comments: (Patient consented for risks of anesthesia including but not limited to:  - adverse reactions to medications - risk of airway placement if required - damage to eyes, teeth, lips or other oral mucosa - nerve damage due to positioning  - sore throat or hoarseness - Damage to heart, brain, nerves, lungs, other  parts of body or loss of life  Patient voiced understanding.)        Anesthesia Quick Evaluation

## 2020-12-18 ENCOUNTER — Encounter: Payer: Self-pay | Admitting: Gastroenterology

## 2021-06-18 ENCOUNTER — Other Ambulatory Visit: Payer: Self-pay

## 2021-06-18 ENCOUNTER — Ambulatory Visit: Payer: Medicare PPO | Attending: Family Medicine

## 2021-06-18 DIAGNOSIS — M5441 Lumbago with sciatica, right side: Secondary | ICD-10-CM | POA: Diagnosis not present

## 2021-06-18 DIAGNOSIS — G8929 Other chronic pain: Secondary | ICD-10-CM | POA: Insufficient documentation

## 2021-06-18 NOTE — Therapy (Signed)
[facility] [address]. [place], [place], [address] Phone: [phone]   Fax:  [phone]  Physical Therapy Evaluation  Patient Details  Name: Amanda Watson MRN: [id number] Date of Birth: [birth year] Referring Provider (PT): Loree Fee Meeler FNP  Encounter Date: 06/18/2021   PT End of Session - 06/19/21 0905     Visit Number 1    Number of Visits 25    Date for PT Re-Evaluation 09/10/21    Authorization Type eval: 06/18/21    PT Start Time [id number]    PT Stop Time [id number]    PT Time Calculation (min) 50 min    Activity Tolerance Patient tolerated treatment well    Behavior During Therapy [facility] for tasks assessed/performed             Past Medical History:  Diagnosis Date   Anemia    Asthma    Cancer ([place]) 1999   cervical ca   Diabetes mellitus without complication ([place])    GERD (gastroesophageal reflux disease)    History of Bell's palsy    Hypercholesteremia    Hyperlipidemia    Hypertension    Multinodular goiter    Psoriasis    Sleep apnea    uses CPAP   Spinal stenosis     Past Surgical History:  Procedure Laterality Date   BROW LIFT Bilateral 03/22/2018   Procedure: BLEPHAROPLASTY UPPER EYELID WITH EXCESS SKIN DIABETIC;  Surgeon: Karle Starch, MD;  Location: [facility];  Service: Ophthalmology;  Laterality: Bilateral;  DIABETIC-oral meds   CARPAL TUNNEL RELEASE Bilateral    CESAREAN SECTION  09/1981   CHOLECYSTECTOMY  1983   COLONOSCOPY WITH PROPOFOL N/A 12/17/2020   Procedure: COLONOSCOPY WITH PROPOFOL;  Surgeon: Lesly Rubenstein, MD;  Location: [facility];  Service: Endoscopy;  Laterality: N/A;   EYE SURGERY Bilateral 2018   eyelid surgery   HYSTERECTOMY ABDOMINAL WITH SALPINGECTOMY Bilateral 08/15/2020   Procedure: HYSTERECTOMY ABDOMINAL WITH BILATERAL  SALPINGO OOPHORECTOMY;  Surgeon: Schermerhorn, Gwen Her, MD;  Location: [facility];  Service: Gynecology;  Laterality: Bilateral;   HYSTEROSCOPY  WITH D & C N/A 06/14/2020   Procedure: FRACTIONAL DILATATION AND CURETTAGE /HYSTEROSCOPY;  Surgeon: Schermerhorn, Gwen Her, MD;  Location: [facility];  Service: Gynecology;  Laterality: N/A;   JOINT REPLACEMENT Left    hip   TOTAL HIP ARTHROPLASTY Left    UMBILICAL HERNIA REPAIR N/A 06/14/2015   Procedure: HERNIA REPAIR UMBILICAL ADULT;  Surgeon: Leonie Green, MD;  Location: [facility];  Service: General;  Laterality: N/A;    There were no vitals filed for this visit.    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 Pain started at work when she bent over to pick up a heavy tray. Pain initially was localized in the R lower back and posterior hip. Since onset pain has been intermittent in nature. She went to a chiropractor years ago which helped and she also uses Biofreeze to ease the pain. She went to see Dr. Sharlet Salina years ago who tried injections however after insufficient relief she was referred to Dr. Marry Guan secondary to persistent L hip pain. Pt underwent a L posterior approach THR which did alleviate a significant amount of pain. Currently pain worsens with standing and walking and occasionally with prolonged  sitting. However position of comfort is sitting. Prolonged stationary standing is the worst. She uses a cane intermittently which helps as well. Currently she feels the pain in the R buttock, down the outside of her R thigh and into her R calf. Symptoms are no longer extending into the foot as they had been previously. She took an oral prednisone taper for bronchitis recently which provided moderate relief for her back pain. She is also taking gabapentin which has been helpful and was recently increased. No formal physical therapy for this issue. She underwent a R L5-S1 and R S1 epidural steroid injection on 05/13/21  which provided helpful. Referral was also for cervical DDD and radiculopathy. Pt started having neck pain on 05/13/21 which was causing radicular symptoms down her RUE. She has had significant improvement in symptoms since that time and currently only experiences some tighness in her neck. At this time she reports that neck pain is pretty much gone and she would like to focus on her back pain.    Diagnostic tests See history    Patient Stated Goals To be able to ride longer in the car so she can visit her grandson.    Currently in Pain? Yes    Pain Score 3     Pain Location Hip    Pain Orientation Right;Posterior    Pain Descriptors / Indicators Burning;Tingling;Aching    Pain Type Chronic pain    Pain Radiating Towards Into R buttock, down the outside of her R thigh and into her R calf. Symptoms are no longer extending into the foot as they had been previously.    Pain Onset More than a month ago    Pain Frequency Intermittent    Aggravating Factors  extended sitting, extended standing, extended walking, bending forward    Pain Relieving Factors hanging positions, Biofreeze, Gabapentin, Tylenol, ice;                [facility] PT Assessment - 06/19/21 0901       Assessment   Medical Diagnosis Lumber intervertebral disc degeneration, lumbar radiculopathy, cervcial disc degeneration, cervical radiculopathy    Referring Provider (PT) Loree Fee Meeler FNP    Onset Date/Surgical Date 06/18/08   Approximate   Hand Dominance Right    Next MD Visit Yes, end of September    Prior Therapy Chiropractic care, no formal physical therapy      Precautions   Precautions None      Restrictions   Weight Bearing Restrictions No                 SUBJECTIVE History: Pt reports that she has a 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 Pain started at work when she bent over to pick up a heavy tray. Pain initially was localized in the R  lower back and posterior hip. Since onset pain has been intermittent in nature. She went to a chiropractor years ago which helped and she also uses Biofreeze to ease the pain. She went to see Dr. Sharlet Salina years ago who tried injections however after insufficient relief she was referred to Dr. Marry Guan secondary to persistent L hip pain. Pt underwent a L posterior approach THR which did alleviate a significant amount of pain. Currently pain worsens with standing and walking and occasionally with prolonged sitting. However position of comfort is sitting. Prolonged stationary standing is the worst. She uses a cane intermittently which helps as well. Currently she  feels the pain in the R buttock, down the outside of her R thigh and into her R calf. Symptoms are no longer extending into the foot as they had been previously. She took an oral prednisone taper for bronchitis recently which provided moderate relief for her back pain. She is also taking gabapentin which has been helpful and was recently increased. No formal physical therapy for this issue. She underwent a R L5-S1 and R S1 epidural steroid injection on 05/13/21 which provided helpful. Referral was also for cervical DDD and radiculopathy. Pt started having neck pain on 05/13/21 which was causing radicular symptoms down her RUE. She has had significant improvement in symptoms since that time and currently only experiences some tighness in her neck. At this time she reports that neck pain is pretty much gone and she would like to focus on her back pain.   Referring Provider: Allene Dillon FNP; Pain location: R posterior hip radiating down RLE; Pain: Present 3/10, Best 0/10, Worst 4/10 Pain quality: burning, tingling, dull/ache Radiating pain: Yes, down RLE;  Numbness/Tingling: No 24 hour pain behavior: no pattern noted Aggravating factors: extended sitting, extended standing, extended walking, bending forward Easing factors: changing positions, Biofreeze,  Gabapentin, Tylenol, ice; How long can you sit: 1 hour How long can you stand: 15 minutes; How long can you walk: 15 minutes; History of back injury, pain, surgery, or therapy: Yes Follow-up appointment with MD: Yes, end of September; Dominant hand: right Imaging: Yes, lumbar radiographs on 04/23/21, hasn't had a lumbar MRI "in years"  Falls in the last 6 months: No  Occupational demands: Retired Office manager: cross stick, cooking;  Goals: To be able to ride longer in the car so she can visit her grandson;  Red flags: Pt has a history of cervical cancer summer 1999 (pt had a partial cervix resection). Pt underwent a total hysterectomy last year and states that there was no cancer. (bowel/bladder changes, saddle paresthesia, personal history of cancer, chills/fever, night sweats, unrelenting pain, first onset of insidious LBP <20 y/o) Negative    OBJECTIVE  Mental Status Patient is oriented to person, place and time.  Recent memory is intact.  Remote memory is intact.  Attention span and concentration are intact.  Expressive speech is intact.  Patient's fund of knowledge is within normal limits for educational level.  SENSATION: Grossly intact to light touch bilateral LEs as determined by testing dermatomes L2-S2. Proprioception and hot/cold testing deferred on this date   MUSCULOSKELETAL: Tremor: None Bulk: Normal Tone: Normal No visible step-off along spinal column  Posture Lumbar lordosis: WNL Iliac crest height: equal bilaterally Lumbar lateral shift: negative  Gait Deferred full assessment   Palpation Pt denies any pain to palpation to bilateral lumbar paraspinals. She does have some tenderness to palpation to R posterior hip but not excessive.   Strength (out of 5) R/L 4/5 Hip flexion 5/5 Hip ER 5*/5 Hip IR 4*/4 Hip abduction (seated) 4*/4 Hip adduction 5/5 Knee extension 5/5 Knee flexion (sitting) 5/5 Ankle dorsiflexion Active bilateral ankle  plantarflexion *Indicates pain   AROM (degrees) R/L (all movements include overpressure unless otherwise stated) Lumbar forward flexion (65): severe loss with pain down RLE; Lumbar extension (30): moderate loss with midline low back pain Lumbar lateral flexion (25): R: moderate loss with RLE pain L: moderate loss, no pain;  Thoracic and Lumbar rotation (30 degrees):  R: severe loss* L: minimal loss; Hip IR (0-45): R: 10 L: 45 Hip ER (0-45): R: 45 L: 50 Hip Flexion (  0-125): limited due to familiar R posterior hip pain as hip approaches 90, L side is WNL based on soft tissue approximately; *Indicates pain  Repeated Movements Peripheralization of symptoms with repeated lumbar extension;    Muscle Length Hamstrings: R: pain at 80 degrees in posterior R hip L: approximately 90 degrees    Passive Accessory Intervertebral Motion (PAIVM) Pt denies reproduction of back pain with CPA L1-L5 and UPA bilaterally L1-L5. Generally hypomobile throughout   SPECIAL TESTS Lumbar Radiculopathy and Discogenic: Centralization and Peripheralization (SN 92, -LR 0.12): Positive for peripheralization to R thigh with repeated extension; Slump (SN 83, -LR 0.32): R: Negative L: Negative SLR (SN 92, -LR 0.29): R: Negative, however reproduction of buttock pain as R leg approaches 80 degrees L:  Negative Crossed SLR (SP 90): R: Negative L: Negative  Facet Joint: Extension-Rotation (SN 100, -LR 0.0): R: Negative L: Negative  Lumbar Foraminal Stenosis: Lumbar quadrant (SN 70): R: Negative L: Negative  Hip: FABER (SN 81): R: Pt unable to tolerate adequate hip flexion L: Negative FADIR (SN 94): R: Pt unable to tolerate adequate hip flexion L: Negative Hip scour (SN 50): R: Pt unable to tolerate adequate hip flexion L: Negative  SIJ:  Thigh Thrust (SN 88, -LR 0.18) : R: Not examined L: Not examined  Piriformis Syndrome: FAIR Test (SN 88, SP 83): R: Not examined L: Not examined  Functional  Tasks Deferred        Objective measurements completed on examination: See above findings.                PT Education - 06/19/21 0905     Education Details Plan of care    Person(s) Educated Patient    Methods Explanation    Comprehension Verbalized understanding              PT Short Term Goals - 06/19/21 0917       PT SHORT TERM GOAL #1   Title Pt will be independent with HEP in order to improve strength and decrease back pain in order to improve pain-free function at home    Time 6    Period Weeks    Status New    Target Date 07/30/21               PT Long Term Goals - 06/19/21 0918       PT LONG TERM GOAL #1   Title Pt will decrease mODI score by at least 13 points in order demonstrate clinically significant reduction in back pain/disability.    Baseline 06/18/21: To complete at next visit    Time 12    Period Weeks    Status New    Target Date 09/10/21      PT LONG TERM GOAL #2   Title Pt will decrease worst back pain as reported on NPRS by at least 2 points in order to demonstrate clinically significant reduction in back pain.    Baseline 06/18/21: Worst: 4/10;    Time 12    Period Weeks    Status New    Target Date 09/10/21      PT LONG TERM GOAL #3   Title Pt will increase FOTO to at least 62 in order to demonstrate significant improvement in function related to back pain    Baseline 06/18/21: 56    Time 12    Period Weeks    Status New    Target Date 09/10/21      PT LONG TERM GOAL #  4   Title Pt will report at least 50% improvement in her symptoms related to her low back in order to be able to ride in the car for at least 2 hours in order to be able to ride in the car long enough to visit her grandson    Baseline 06/18/21: can sit for 1 hours    Time 12    Period Weeks    Status New    Target Date 09/10/21                    Plan - 06/19/21 0905     Clinical Impression Statement Pt is a pleasant 70 year-old  female referred for low back pain and neck pain.  At this point she reports that her neck pain is mostly resolved and would like to focus for therapy on her low back.  Patient with significant loss of range of motion lumbar spine in all directions with pain reported during flexion, extension, right lateral flexion and right rotation. Painful and weak resisted R hip internal rotation, abduction, and extension. Mild pain to palpation over posterior right hip. Pt presents with deficits in strength, range of motion, and pain and will benefit from skilled PT services to address deficits and return to pain-free function at home.    Personal Factors and Comorbidities Age;Comorbidity 3+;Past/Current Experience;Time since onset of injury/illness/exacerbation;Fitness    Comorbidities DM, HTN, spinal stenosis    Examination-Activity Limitations Bend;Lift;Sit;Stand    Examination-Participation Restrictions Community Activity;Driving;Meal Prep    Stability/Clinical Decision Making Unstable/Unpredictable    Clinical Decision Making High    Rehab Potential Fair    PT Frequency 2x / week    PT Duration 12 weeks    PT Treatment/Interventions ADLs/Self Care Home Management;Aquatic Therapy;Biofeedback;Canalith Repostioning;Cryotherapy;Electrical Stimulation;Iontophoresis '4mg'$ /ml Dexamethasone;Traction;Moist Heat;Ultrasound;Gait training;Functional mobility training;Therapeutic activities;Therapeutic exercise;Neuromuscular re-education;Manual techniques;Passive range of motion;Dry needling;Vestibular;Spinal Manipulations;Joint Manipulations    PT Next Visit Plan Pt to complete mODI, assess gait, manual therapy to lower lumbar spine and R hip, initiate HEP    PT Home Exercise Plan None currently    Consulted and Agree with Plan of Care Patient             Patient will benefit from skilled therapeutic intervention in order to improve the following deficits and impairments:  Decreased range of motion, Decreased  strength, Pain  Visit Diagnosis: Chronic right-sided low back pain with right-sided sciatica - Plan: PT plan of care cert/re-cert     Problem List Patient Active Problem List   Diagnosis Date Noted   Atypical endometrial hyperplasia 08/15/2020   Post-operative state 08/15/2020   Phillips Grout PT, DPT, GCS  Malary Aylesworth, PT 06/19/2021, 10:39 AM  [place] [facility] [address] [place], [place], [address] Phone: [phone]   Fax:  [phone]  Name: KAITYLYN MUGAVERO MRN: [id number] Date of Birth: 28-Sep-1951

## 2021-06-23 ENCOUNTER — Other Ambulatory Visit: Payer: Self-pay

## 2021-06-23 ENCOUNTER — Ambulatory Visit: Payer: Medicare PPO

## 2021-06-23 DIAGNOSIS — G8929 Other chronic pain: Secondary | ICD-10-CM

## 2021-06-23 DIAGNOSIS — M5441 Lumbago with sciatica, right side: Secondary | ICD-10-CM | POA: Diagnosis not present

## 2021-06-23 NOTE — Therapy (Signed)
[facility] [address] [place], [place], [address] Phone: [phone]   Fax:  [phone]  Physical Therapy Treatment  Patient Details  Name: Amanda Watson MRN: [id number] Date of Birth: [birth year] Referring Provider (PT): Loree Fee Meeler FNP   Encounter Date: 06/23/2021   PT End of Session - 06/23/21 1124     Visit Number 2    Number of Visits 25    Date for PT Re-Evaluation 09/10/21    Authorization Type eval: 06/18/21    PT Start Time 1145    PT Stop Time 1230    PT Time Calculation (min) 45 min    Activity Tolerance Patient tolerated treatment well    Behavior During Therapy [facility] for tasks assessed/performed             Past Medical History:  Diagnosis Date   Anemia    Asthma    Cancer ([place]) 1999   cervical ca   Diabetes mellitus without complication ([place])    GERD (gastroesophageal reflux disease)    History of Bell's palsy    Hypercholesteremia    Hyperlipidemia    Hypertension    Multinodular goiter    Psoriasis    Sleep apnea    uses CPAP   Spinal stenosis     Past Surgical History:  Procedure Laterality Date   BROW LIFT Bilateral 03/22/2018   Procedure: BLEPHAROPLASTY UPPER EYELID WITH EXCESS SKIN DIABETIC;  Surgeon: Karle Starch, MD;  Location: [place];  Service: Ophthalmology;  Laterality: Bilateral;  DIABETIC-oral meds   CARPAL TUNNEL RELEASE Bilateral    CESAREAN SECTION  09/1981   CHOLECYSTECTOMY  1983   COLONOSCOPY WITH PROPOFOL N/A 12/17/2020   Procedure: COLONOSCOPY WITH PROPOFOL;  Surgeon: Lesly Rubenstein, MD;  Location: [facility];  Service: Endoscopy;  Laterality: N/A;   EYE SURGERY Bilateral 2018   eyelid surgery   HYSTERECTOMY ABDOMINAL WITH SALPINGECTOMY Bilateral 08/15/2020   Procedure: HYSTERECTOMY ABDOMINAL WITH BILATERAL  SALPINGO OOPHORECTOMY;  Surgeon: Schermerhorn, Gwen Her, MD;  Location: [facility];  Service: Gynecology;  Laterality: Bilateral;   HYSTEROSCOPY  WITH D & C N/A 06/14/2020   Procedure: FRACTIONAL DILATATION AND CURETTAGE /HYSTEROSCOPY;  Surgeon: Schermerhorn, Gwen Her, MD;  Location: [facility];  Service: Gynecology;  Laterality: N/A;   JOINT REPLACEMENT Left    hip   TOTAL HIP ARTHROPLASTY Left    UMBILICAL HERNIA REPAIR N/A 06/14/2015   Procedure: HERNIA REPAIR UMBILICAL ADULT;  Surgeon: Leonie Green, MD;  Location: [facility];  Service: General;  Laterality: N/A;    There were no vitals filed for this visit.   Subjective Assessment - 06/23/21 1122     Subjective Pt reports 2/10 R posterior thigh pain upon arrival today. She had some soreness after the initial evaluation.  Otherwise no specific questions or concerns upon arrival.    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 Pain started at work when she bent over to pick up a heavy tray. Pain initially was localized in the R lower back and posterior hip. Since onset pain has been intermittent in nature. She went to a chiropractor years ago which helped and she also uses Biofreeze to ease the pain. She went to see Dr. Sharlet Salina years ago who tried injections however after insufficient relief she was referred to Dr. Marry Guan secondary to persistent L hip pain. Pt underwent a  L posterior approach THR which did alleviate a significant amount of pain. Currently pain worsens with standing and walking and occasionally with prolonged sitting. However position of comfort is sitting. Prolonged stationary standing is the worst. She uses a cane intermittently which helps as well. Currently she feels the pain in the R buttock, down the outside of her R thigh and into her R calf. Symptoms are no longer extending into the foot as they had been previously. She took an oral prednisone taper for bronchitis recently which provided moderate relief for her back pain. She is also taking gabapentin which has been  helpful and was recently increased. No formal physical therapy for this issue. She underwent a R L5-S1 and R S1 epidural steroid injection on 05/13/21 which provided helpful. Referral was also for cervical DDD and radiculopathy. Pt started having neck pain on 05/13/21 which was causing radicular symptoms down her RUE. She has had significant improvement in symptoms since that time and currently only experiences some tighness in her neck. At this time she reports that neck pain is pretty much gone and she would like to focus on her back pain.    Diagnostic tests See history    Patient Stated Goals To be able to ride longer in the car so she can visit her grandson.                TREATMENT   Ther-ex  Pt completed mODI: 28%; Hooklying lumbar rotational rocking x1 minute;  Hooklying anterior/posterior pelvic tilts 5-second hold x10 each direction; Seated forward flexion stretch x30 seconds; Seated right hamstring stretch x30 seconds; HEP issued with handout and education about how to perform correctly;   Manual Therapy  NuStep level 0 x 5 minutes during interval history (3 minutes unbilled); Prone STM using theraband roller to R lumbar paraspinals, right glute max, and right glute med;  Prone CPA and R UPA L1-L5 grade I-II, 30s/bout x 1 bouts/level each; Supine R single knee to chest stretch x 30s; Supine R FADIR stretch attempted however patient is unable to tolerate; Right hip inferior mobilizations with hip flexed to 45 degrees, grade II-III, 30s/bout x 3 bouts; R hip long axis distraction mobilizations, grade III, 30s/bout x 3 bouts; Attempted right hip inferior mobilizations in FABER position however patient is unable to tolerate positioning;    Pt educated throughout session about proper posture and technique with exercises. Improved exercise technique, movement at target joints, use of target muscles after min to mod verbal, visual, tactile cues.    Patient demonstrates  excellent motivation during session today.  Initiated manual techniques for lower lumbar spine and right posterior hip.  Patient continues to demonstrate significant lack of right hip internal rotation.  She is unable to tolerate stretching in FADIR and FABER positions.  Initiated exercises with patient and issued HEP with handout education about how to perform correctly at home.  Patient encouraged to follow-up scheduled.  She will benefit from skilled PT services to address deficits in pain in order to improve function at home.                   PT Short Term Goals - 06/19/21 0917       PT SHORT TERM GOAL #1   Title Pt will be independent with HEP in order to improve strength and decrease back pain in order to improve pain-free function at home    Time 6    Period Weeks    Status New  Target Date 07/30/21               PT Long Term Goals - 06/23/21 1152       PT LONG TERM GOAL #1   Title Pt will decrease mODI score by at least 13 points in order demonstrate clinically significant reduction in back pain/disability.    Baseline 06/18/21: To complete at next visit    Time 12    Period Weeks    Status New      PT LONG TERM GOAL #2   Title Pt will decrease worst back pain as reported on NPRS by at least 2 points in order to demonstrate clinically significant reduction in back pain.    Baseline 06/18/21: Worst: 4/10;    Time 12    Period Weeks    Status New      PT LONG TERM GOAL #3   Title Pt will increase FOTO to at least 62 in order to demonstrate significant improvement in function related to back pain    Baseline 06/18/21: 56    Time 12    Period Weeks    Status New      PT LONG TERM GOAL #4   Title Pt will report at least 50% improvement in her symptoms related to her low back in order to be able to ride in the car for at least 2 hours in order to be able to ride in the car long enough to visit her grandson    Baseline 06/18/21: can sit for 1 hours     Time 12    Period Weeks    Status New                   Plan - 06/23/21 1125     Clinical Impression Statement Patient demonstrates excellent motivation during session today.  Initiated manual techniques for lower lumbar spine and right posterior hip.  Patient continues to demonstrate significant lack of right hip internal rotation.  She is unable to tolerate stretching in FADIR and FABER positions.  Initiated exercises with patient and issued HEP with handout education about how to perform correctly at home.  Patient encouraged to follow-up scheduled.  She will benefit from skilled PT services to address deficits in pain in order to improve function at home.    Personal Factors and Comorbidities Age;Comorbidity 3+;Past/Current Experience;Time since onset of injury/illness/exacerbation;Fitness    Comorbidities DM, HTN, spinal stenosis    Examination-Activity Limitations Bend;Lift;Sit;Stand    Examination-Participation Restrictions Community Activity;Driving;Meal Prep    Stability/Clinical Decision Making Unstable/Unpredictable    Rehab Potential Fair    PT Frequency 2x / week    PT Duration 12 weeks    PT Treatment/Interventions ADLs/Self Care Home Management;Aquatic Therapy;Biofeedback;Canalith Repostioning;Cryotherapy;Electrical Stimulation;Iontophoresis 4mg /ml Dexamethasone;Traction;Moist Heat;Ultrasound;Gait training;Functional mobility training;Therapeutic activities;Therapeutic exercise;Neuromuscular re-education;Manual techniques;Passive range of motion;Dry needling;Vestibular;Spinal Manipulations;Joint Manipulations    PT Next Visit Plan Pt to complete mODI, assess gait, manual therapy to lower lumbar spine and R hip, initiate HEP    PT Home Exercise Plan None currently    Consulted and Agree with Plan of Care Patient             Patient will benefit from skilled therapeutic intervention in order to improve the following deficits and impairments:  Decreased range of  motion, Decreased strength, Pain  Visit Diagnosis: Chronic right-sided low back pain with right-sided sciatica     Problem List Patient Active Problem List   Diagnosis Date Noted   Atypical endometrial  hyperplasia 08/15/2020   Post-operative state 08/15/2020   Phillips Grout PT, DPT, GCS  Amanda Watson, PT 06/23/2021, 4:49 PM  [place] [facility] [address] [place], [place], [address] Phone: [phone]   Fax:  [phone]  Name: Amanda Watson MRN: [id number] Date of Birth: 1951-06-24

## 2021-06-23 NOTE — Patient Instructions (Signed)
Access Code: [email or web address] URL: [email or web address] Date: 06/23/2021 Prepared by: Roxana Hires  Exercises Hooklying Lumbar Rotation - 2 x daily - 7 x weekly - 2 sets - 10 reps - 5s hold Supine Pelvic Tilt - 2 x daily - 7 x weekly - 2 sets - 10 reps - 5s hold Seated Flexion Stretch - 2 x daily - 7 x weekly - 2 sets - 10 reps - 30s hold Seated Hamstring Stretch - 2 x daily - 7 x weekly - 3 reps - 30s hold

## 2021-06-25 ENCOUNTER — Other Ambulatory Visit: Payer: Self-pay

## 2021-06-25 ENCOUNTER — Ambulatory Visit: Payer: Medicare PPO

## 2021-06-25 DIAGNOSIS — G8929 Other chronic pain: Secondary | ICD-10-CM

## 2021-06-25 DIAGNOSIS — M5441 Lumbago with sciatica, right side: Secondary | ICD-10-CM | POA: Diagnosis not present

## 2021-06-25 NOTE — Therapy (Signed)
[facility] [address] [place], [place], [address] Phone: [phone]   Fax:  [phone]  Physical Therapy Treatment  Patient Details  Name: Amanda Watson MRN: [id number] Date of Birth: [birth year] Referring Provider (PT): Loree Fee Meeler FNP   Encounter Date: 06/25/2021   PT End of Session - 06/25/21 1128     Visit Number 3    Number of Visits 25    Date for PT Re-Evaluation 09/10/21    Authorization Type eval: 06/18/21    PT Start Time 1146    PT Stop Time 1230    PT Time Calculation (min) 44 min    Activity Tolerance Patient tolerated treatment well    Behavior During Therapy [facility] for tasks assessed/performed             Past Medical History:  Diagnosis Date   Anemia    Asthma    Cancer ([place]) 1999   cervical ca   Diabetes mellitus without complication ([place])    GERD (gastroesophageal reflux disease)    History of Bell's palsy    Hypercholesteremia    Hyperlipidemia    Hypertension    Multinodular goiter    Psoriasis    Sleep apnea    uses CPAP   Spinal stenosis     Past Surgical History:  Procedure Laterality Date   BROW LIFT Bilateral 03/22/2018   Procedure: BLEPHAROPLASTY UPPER EYELID WITH EXCESS SKIN DIABETIC;  Surgeon: Karle Starch, MD;  Location: [place];  Service: Ophthalmology;  Laterality: Bilateral;  DIABETIC-oral meds   CARPAL TUNNEL RELEASE Bilateral    CESAREAN SECTION  09/1981   CHOLECYSTECTOMY  1983   COLONOSCOPY WITH PROPOFOL N/A 12/17/2020   Procedure: COLONOSCOPY WITH PROPOFOL;  Surgeon: Lesly Rubenstein, MD;  Location: [facility];  Service: Endoscopy;  Laterality: N/A;   EYE SURGERY Bilateral 2018   eyelid surgery   HYSTERECTOMY ABDOMINAL WITH SALPINGECTOMY Bilateral 08/15/2020   Procedure: HYSTERECTOMY ABDOMINAL WITH BILATERAL  SALPINGO OOPHORECTOMY;  Surgeon: Schermerhorn, Gwen Her, MD;  Location: [facility];  Service: Gynecology;  Laterality: Bilateral;   HYSTEROSCOPY  WITH D & C N/A 06/14/2020   Procedure: FRACTIONAL DILATATION AND CURETTAGE /HYSTEROSCOPY;  Surgeon: Schermerhorn, Gwen Her, MD;  Location: [facility];  Service: Gynecology;  Laterality: N/A;   JOINT REPLACEMENT Left    hip   TOTAL HIP ARTHROPLASTY Left    UMBILICAL HERNIA REPAIR N/A 06/14/2015   Procedure: HERNIA REPAIR UMBILICAL ADULT;  Surgeon: Leonie Green, MD;  Location: [facility];  Service: General;  Laterality: N/A;    There were no vitals filed for this visit.   Subjective Assessment - 06/25/21 1128     Subjective Pt reports 2/10 R posterior thigh pain upon arrival today. She had some soreness after the last therapy session. She has been performing her HEP with some soreness afterwards. No specific questions or concerns upon arrival.    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 Pain started at work when she bent over to pick up a heavy tray. Pain initially was localized in the R lower back and posterior hip. Since onset pain has been intermittent in nature. She went to a chiropractor years ago which helped and she also uses Biofreeze to ease the pain. She went to see Dr. Sharlet Salina years ago who tried injections however after insufficient relief she was referred to Dr. Marry Guan  secondary to persistent L hip pain. Pt underwent a L posterior approach THR which did alleviate a significant amount of pain. Currently pain worsens with standing and walking and occasionally with prolonged sitting. However position of comfort is sitting. Prolonged stationary standing is the worst. She uses a cane intermittently which helps as well. Currently she feels the pain in the R buttock, down the outside of her R thigh and into her R calf. Symptoms are no longer extending into the foot as they had been previously. She took an oral prednisone taper for bronchitis recently which provided moderate relief for her back  pain. She is also taking gabapentin which has been helpful and was recently increased. No formal physical therapy for this issue. She underwent a R L5-S1 and R S1 epidural steroid injection on 05/13/21 which provided helpful. Referral was also for cervical DDD and radiculopathy. Pt started having neck pain on 05/13/21 which was causing radicular symptoms down her RUE. She has had significant improvement in symptoms since that time and currently only experiences some tighness in her neck. At this time she reports that neck pain is pretty much gone and she would like to focus on her back pain.    Diagnostic tests See history    Patient Stated Goals To be able to ride longer in the car so she can visit her grandson.                TREATMENT   Ther-ex  NuStep level 0-2 x 5 minutes during interval history (3 minutes unbilled); Hooklying lumbar rotational rocking x1 minute;  Hooklying anterior/posterior pelvic tilts 5-second hold x10 each direction; Hook lying posterior pelvic tilts with alternating marches x10 BLE;   Manual Therapy Prone STM using theraband roller and [employer] to R lumbar paraspinals, right glute max, and right glute med;  Prone CPA and R UPA L1-L5 grade I-II, 30s/bout x 1 bouts/level each; Supine R single knee to chest stretch x 30s; Supine R FADIR and FABER stretch x 30s each; Right hip inferior mobilizations with hip flexed to 45 degrees, grade II-III, 30s/bout x 3 bouts; R hip long axis distraction mobilizations, grade III, 30s/bout x 2 bouts; Attempted right hip inferior mobilizations in FABER position however patient is unable to tolerate positioning;    Pt educated throughout session about proper posture and technique with exercises. Improved exercise technique, movement at target joints, use of target muscles after min to mod verbal, visual, tactile cues.    Patient demonstrates excellent motivation during session today.  Continued manual techniques for lower  lumbar spine and right posterior hip.  Patient continues to demonstrate significant lack of right hip internal rotation.  She is able to tolerate stretching in FADIR and FABER positions today when she was unable to tolerate during previous sessions.  Continued exercises with patient and no HEP modifications provided today.  Patient encouraged to follow-up scheduled.  She will benefit from skilled PT services to address deficits in pain in order to improve function at home.                   PT Short Term Goals - 06/19/21 0917       PT SHORT TERM GOAL #1   Title Pt will be independent with HEP in order to improve strength and decrease back pain in order to improve pain-free function at home    Time 6    Period Weeks    Status New    Target Date 07/30/21  PT Long Term Goals - 06/25/21 1224       PT LONG TERM GOAL #1   Title Pt will decrease mODI score by at least 13 points in order demonstrate clinically significant reduction in back pain/disability.    Baseline 06/18/21: To complete at next visit; 06/23/20: 28%    Time 12    Period Weeks    Status New    Target Date 09/10/21      PT LONG TERM GOAL #2   Title Pt will decrease worst back pain as reported on NPRS by at least 2 points in order to demonstrate clinically significant reduction in back pain.    Baseline 06/18/21: Worst: 4/10;    Time 12    Period Weeks    Status New    Target Date 09/10/21      PT LONG TERM GOAL #3   Title Pt will increase FOTO to at least 62 in order to demonstrate significant improvement in function related to back pain    Baseline 06/18/21: 56    Time 12    Period Weeks    Status New    Target Date 09/10/21      PT LONG TERM GOAL #4   Title Pt will report at least 50% improvement in her symptoms related to her low back in order to be able to ride in the car for at least 2 hours in order to be able to ride in the car long enough to visit her grandson    Baseline  06/18/21: can sit for 1 hours    Time 12    Period Weeks    Status New    Target Date 09/10/21                   Plan - 06/25/21 1152     Clinical Impression Statement Patient demonstrates excellent motivation during session today.  Continued manual techniques for lower lumbar spine and right posterior hip.  Patient continues to demonstrate significant lack of right hip internal rotation.  She is able to tolerate stretching in FADIR and FABER positions today when she was unable to tolerate during previous sessions.  Continued exercises with patient and no HEP modifications provided today.  Patient encouraged to follow-up scheduled.  She will benefit from skilled PT services to address deficits in pain in order to improve function at home.    Personal Factors and Comorbidities Age;Comorbidity 3+;Past/Current Experience;Time since onset of injury/illness/exacerbation;Fitness    Comorbidities DM, HTN, spinal stenosis    Examination-Activity Limitations Bend;Lift;Sit;Stand    Examination-Participation Restrictions Community Activity;Driving;Meal Prep    Stability/Clinical Decision Making Unstable/Unpredictable    Rehab Potential Fair    PT Frequency 2x / week    PT Duration 12 weeks    PT Treatment/Interventions ADLs/Self Care Home Management;Aquatic Therapy;Biofeedback;Canalith Repostioning;Cryotherapy;Electrical Stimulation;Iontophoresis 4mg /ml Dexamethasone;Traction;Moist Heat;Ultrasound;Gait training;Functional mobility training;Therapeutic activities;Therapeutic exercise;Neuromuscular re-education;Manual techniques;Passive range of motion;Dry needling;Vestibular;Spinal Manipulations;Joint Manipulations    PT Next Visit Plan Pt to complete mODI, assess gait, manual therapy to lower lumbar spine and R hip, initiate HEP    PT Home Exercise Plan None currently    Consulted and Agree with Plan of Care Patient             Patient will benefit from skilled therapeutic intervention in  order to improve the following deficits and impairments:  Decreased range of motion, Decreased strength, Pain  Visit Diagnosis: Chronic right-sided low back pain with right-sided sciatica     Problem List Patient  Active Problem List   Diagnosis Date Noted   Atypical endometrial hyperplasia 08/15/2020   Post-operative state 08/15/2020   Phillips Grout PT, DPT, GCS  Sargent Mankey, PT 06/25/2021, 2:01 PM  [place] [facility] [address] [place], [place], [address] Phone: [phone]   Fax:  [phone]  Name: LYLIANNA FRAISER MRN: [id number] Date of Birth: 12-Mar-1951

## 2021-06-30 ENCOUNTER — Other Ambulatory Visit: Payer: Self-pay

## 2021-06-30 ENCOUNTER — Ambulatory Visit: Payer: Medicare PPO

## 2021-06-30 DIAGNOSIS — G8929 Other chronic pain: Secondary | ICD-10-CM

## 2021-06-30 DIAGNOSIS — M5441 Lumbago with sciatica, right side: Secondary | ICD-10-CM | POA: Diagnosis not present

## 2021-06-30 NOTE — Therapy (Signed)
[facility] [address] [place], [place], [address] Phone: [phone]   Fax:  [phone]  Physical Therapy Treatment  Patient Details  Name: Amanda Watson MRN: [id number] Date of Birth: [birth year] Referring Provider (PT): Loree Fee Meeler FNP   Encounter Date: 06/30/2021   PT End of Session - 06/30/21 1229     Visit Number 4    Number of Visits 25    Date for PT Re-Evaluation 09/10/21    Authorization Type eval: 06/18/21    PT Start Time 1145    PT Stop Time 1230    PT Time Calculation (min) 45 min    Activity Tolerance Patient tolerated treatment well    Behavior During Therapy [facility] for tasks assessed/performed              Past Medical History:  Diagnosis Date   Anemia    Asthma    Cancer ([place]) 1999   cervical ca   Diabetes mellitus without complication ([place])    GERD (gastroesophageal reflux disease)    History of Bell's palsy    Hypercholesteremia    Hyperlipidemia    Hypertension    Multinodular goiter    Psoriasis    Sleep apnea    uses CPAP   Spinal stenosis     Past Surgical History:  Procedure Laterality Date   BROW LIFT Bilateral 03/22/2018   Procedure: BLEPHAROPLASTY UPPER EYELID WITH EXCESS SKIN DIABETIC;  Surgeon: Karle Starch, MD;  Location: [place];  Service: Ophthalmology;  Laterality: Bilateral;  DIABETIC-oral meds   CARPAL TUNNEL RELEASE Bilateral    CESAREAN SECTION  09/1981   CHOLECYSTECTOMY  1983   COLONOSCOPY WITH PROPOFOL N/A 12/17/2020   Procedure: COLONOSCOPY WITH PROPOFOL;  Surgeon: Lesly Rubenstein, MD;  Location: [facility];  Service: Endoscopy;  Laterality: N/A;   EYE SURGERY Bilateral 2018   eyelid surgery   HYSTERECTOMY ABDOMINAL WITH SALPINGECTOMY Bilateral 08/15/2020   Procedure: HYSTERECTOMY ABDOMINAL WITH BILATERAL  SALPINGO OOPHORECTOMY;  Surgeon: Schermerhorn, Gwen Her, MD;  Location: [facility];  Service: Gynecology;  Laterality: Bilateral;   HYSTEROSCOPY  WITH D & C N/A 06/14/2020   Procedure: FRACTIONAL DILATATION AND CURETTAGE /HYSTEROSCOPY;  Surgeon: Schermerhorn, Gwen Her, MD;  Location: [facility];  Service: Gynecology;  Laterality: N/A;   JOINT REPLACEMENT Left    hip   TOTAL HIP ARTHROPLASTY Left    UMBILICAL HERNIA REPAIR N/A 06/14/2015   Procedure: HERNIA REPAIR UMBILICAL ADULT;  Surgeon: Leonie Green, MD;  Location: [facility];  Service: General;  Laterality: N/A;    There were no vitals filed for this visit.   Subjective Assessment - 06/30/21 1155     Subjective Pt reports 2-3/10 R posterior thigh pain upon arrival today. She has been more sornesse since her last therapy session. She has been performing her HEP and feels like she is able to continue her strenthening independently at home. No specific questions or concerns upon arrival.    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 Pain started at work when she bent over to pick up a heavy tray. Pain initially was localized in the R lower back and posterior hip. Since onset pain has been intermittent in nature. She went to a chiropractor years ago which helped and she also uses Biofreeze to ease the pain. She went to see Dr. Sharlet Salina years ago who tried  injections however after insufficient relief she was referred to Dr. Marry Guan secondary to persistent L hip pain. Pt underwent a L posterior approach THR which did alleviate a significant amount of pain. Currently pain worsens with standing and walking and occasionally with prolonged sitting. However position of comfort is sitting. Prolonged stationary standing is the worst. She uses a cane intermittently which helps as well. Currently she feels the pain in the R buttock, down the outside of her R thigh and into her R calf. Symptoms are no longer extending into the foot as they had been previously. She took an oral prednisone taper for bronchitis  recently which provided moderate relief for her back pain. She is also taking gabapentin which has been helpful and was recently increased. No formal physical therapy for this issue. She underwent a R L5-S1 and R S1 epidural steroid injection on 05/13/21 which provided helpful. Referral was also for cervical DDD and radiculopathy. Pt started having neck pain on 05/13/21 which was causing radicular symptoms down her RUE. She has had significant improvement in symptoms since that time and currently only experiences some tighness in her neck. At this time she reports that neck pain is pretty much gone and she would like to focus on her back pain.    Diagnostic tests See history    Patient Stated Goals To be able to ride longer in the car so she can visit her grandson.                 TREATMENT   Ther-ex SciFit x 5 minutes during interval history (3 minutes unbilled); Prone alternating hip extension 2x10 BLE; Prone alternating hamstring curls with manual resistance from therapist 2x10 BLE; Hooklying lumbar rotational rocking 2 x1 minute;  Hooklying posterior pelvic tilts with alternating marches x10 BLE; Hooklying bridges 2 x 10; Hooklying SLR 2 x 10 BLE; Hooklying clams with manual resistance from therapist 2 x 10; Hooklying adductor squeeze with manual resistance from therapist 2 x 10; Discussed plans moving forward with respect updating goals and possible discharge;   Manual Therapy Prone STM using theraband roller and Hypervolt to R lumbar paraspinals, right glute max, and right glute med;  Prone CPA and R UPA L1-L5 grade I-II, 30s/bout x 1 bouts/level each;     Pt educated throughout session about proper posture and technique with exercises. Improved exercise technique, movement at target joints, use of target muscles after min to mod verbal, visual, tactile cues.    Patient demonstrates excellent motivation during session today.  Continued manual techniques for lower lumbar spine  but avoided techniques to right hip as patient reports additional pain after last session and thinks it may have been related to the manual therapy to her hip.  Performed additional strengthening during session today and patient is able to complete without any reported increase in her pain.  Patient reports she is ready to continue her HEP independently.  Will update her outcome measures and goals at next session and consider discharge.  No HEP modifications provided on this date. Patient encouraged to follow-up scheduled.  She will benefit from skilled PT services to address deficits in pain in order to improve function at home.                   PT Short Term Goals - 06/19/21 0917       PT SHORT TERM GOAL #1   Title Pt will be independent with HEP in order to improve strength and decrease back  pain in order to improve pain-free function at home    Time 6    Period Weeks    Status New    Target Date 07/30/21               PT Long Term Goals - 06/25/21 1224       PT LONG TERM GOAL #1   Title Pt will decrease mODI score by at least 13 points in order demonstrate clinically significant reduction in back pain/disability.    Baseline 06/18/21: To complete at next visit; 06/23/20: 28%    Time 12    Period Weeks    Status New    Target Date 09/10/21      PT LONG TERM GOAL #2   Title Pt will decrease worst back pain as reported on NPRS by at least 2 points in order to demonstrate clinically significant reduction in back pain.    Baseline 06/18/21: Worst: 4/10;    Time 12    Period Weeks    Status New    Target Date 09/10/21      PT LONG TERM GOAL #3   Title Pt will increase FOTO to at least 62 in order to demonstrate significant improvement in function related to back pain    Baseline 06/18/21: 56    Time 12    Period Weeks    Status New    Target Date 09/10/21      PT LONG TERM GOAL #4   Title Pt will report at least 50% improvement in her symptoms related to her  low back in order to be able to ride in the car for at least 2 hours in order to be able to ride in the car long enough to visit her grandson    Baseline 06/18/21: can sit for 1 hours    Time 12    Period Weeks    Status New    Target Date 09/10/21                   Plan - 06/30/21 1315     Clinical Impression Statement Patient demonstrates excellent motivation during session today.  Continued manual techniques for lower lumbar spine but avoided techniques to right hip as patient reports additional pain after last session and thinks it may have been related to the manual therapy to her hip.  Performed additional strengthening during session today and patient is able to complete without any reported increase in her pain.  Patient reports she is ready to continue her HEP independently.  Will update her outcome measures and goals at next session and consider discharge.  No HEP modifications provided on this date. Patient encouraged to follow-up scheduled.  She will benefit from skilled PT services to address deficits in pain in order to improve function at home.    Personal Factors and Comorbidities Age;Comorbidity 3+;Past/Current Experience;Time since onset of injury/illness/exacerbation;Fitness    Comorbidities DM, HTN, spinal stenosis    Examination-Activity Limitations Bend;Lift;Sit;Stand    Examination-Participation Restrictions Community Activity;Driving;Meal Prep    Stability/Clinical Decision Making Unstable/Unpredictable    Rehab Potential Fair    PT Frequency 2x / week    PT Duration 12 weeks    PT Treatment/Interventions ADLs/Self Care Home Management;Aquatic Therapy;Biofeedback;Canalith Repostioning;Cryotherapy;Electrical Stimulation;Iontophoresis 4mg /ml Dexamethasone;Traction;Moist Heat;Ultrasound;Gait training;Functional mobility training;Therapeutic activities;Therapeutic exercise;Neuromuscular re-education;Manual techniques;Passive range of motion;Dry  needling;Vestibular;Spinal Manipulations;Joint Manipulations    PT Next Visit Plan manual therapy to lower lumbar spine and R hip    Consulted and Agree with  Plan of Care Patient              Patient will benefit from skilled therapeutic intervention in order to improve the following deficits and impairments:  Decreased range of motion, Decreased strength, Pain  Visit Diagnosis: Chronic right-sided low back pain with right-sided sciatica     Problem List Patient Active Problem List   Diagnosis Date Noted   Atypical endometrial hyperplasia 08/15/2020   Post-operative state 08/15/2020   Phillips Grout PT, DPT, GCS  Herndon Grill, PT 06/30/2021, 4:29 PM  [place] [facility] [address] [place], [place], [address] Phone: [phone]   Fax:  [phone]  Name: Amanda Watson MRN: [id number] Date of Birth: [birth year]

## 2021-07-02 ENCOUNTER — Ambulatory Visit: Payer: Medicare PPO

## 2021-07-02 ENCOUNTER — Other Ambulatory Visit: Payer: Self-pay

## 2021-07-02 DIAGNOSIS — G8929 Other chronic pain: Secondary | ICD-10-CM

## 2021-07-02 DIAGNOSIS — M5441 Lumbago with sciatica, right side: Secondary | ICD-10-CM | POA: Diagnosis not present

## 2021-07-02 NOTE — Therapy (Signed)
[facility] [address] [place], [place], [address] Phone: [phone]   Fax:  [phone]  Physical Therapy Treatment/Discharge  Patient Details  Name: Amanda Watson MRN: [id number] Date of Birth: 09/11/1951 Referring Provider (PT): Loree Fee Meeler FNP   Encounter Date: 07/02/2021   PT End of Session - 07/02/21 1350     Visit Number 5    Number of Visits 25    Date for PT Re-Evaluation 09/10/21    Authorization Type eval: 06/18/21    PT Start Time 1145    PT Stop Time 1230    PT Time Calculation (min) 45 min    Activity Tolerance Patient tolerated treatment well    Behavior During Therapy [facility] for tasks assessed/performed               Past Medical History:  Diagnosis Date   Anemia    Asthma    Cancer ([place]) 1999   cervical ca   Diabetes mellitus without complication ([place])    GERD (gastroesophageal reflux disease)    History of Bell's palsy    Hypercholesteremia    Hyperlipidemia    Hypertension    Multinodular goiter    Psoriasis    Sleep apnea    uses CPAP   Spinal stenosis     Past Surgical History:  Procedure Laterality Date   BROW LIFT Bilateral 03/22/2018   Procedure: BLEPHAROPLASTY UPPER EYELID WITH EXCESS SKIN DIABETIC;  Surgeon: Karle Starch, MD;  Location: [place];  Service: Ophthalmology;  Laterality: Bilateral;  DIABETIC-oral meds   CARPAL TUNNEL RELEASE Bilateral    CESAREAN SECTION  09/1981   CHOLECYSTECTOMY  1983   COLONOSCOPY WITH PROPOFOL N/A 12/17/2020   Procedure: COLONOSCOPY WITH PROPOFOL;  Surgeon: Lesly Rubenstein, MD;  Location: [facility];  Service: Endoscopy;  Laterality: N/A;   EYE SURGERY Bilateral 2018   eyelid surgery   HYSTERECTOMY ABDOMINAL WITH SALPINGECTOMY Bilateral 08/15/2020   Procedure: HYSTERECTOMY ABDOMINAL WITH BILATERAL  SALPINGO OOPHORECTOMY;  Surgeon: Schermerhorn, Gwen Her, MD;  Location: [facility];  Service: Gynecology;  Laterality: Bilateral;    HYSTEROSCOPY WITH D & C N/A 06/14/2020   Procedure: FRACTIONAL DILATATION AND CURETTAGE /HYSTEROSCOPY;  Surgeon: Schermerhorn, Gwen Her, MD;  Location: [facility];  Service: Gynecology;  Laterality: N/A;   JOINT REPLACEMENT Left    hip   TOTAL HIP ARTHROPLASTY Left    UMBILICAL HERNIA REPAIR N/A 06/14/2015   Procedure: HERNIA REPAIR UMBILICAL ADULT;  Surgeon: Leonie Green, MD;  Location: [facility];  Service: General;  Laterality: N/A;    There were no vitals filed for this visit.   Subjective Assessment - 07/02/21 1348     Subjective Pt reports 1/10 R posterior thigh pain upon arrival today. No excessive soreness after the last therapy session. She has been performing her HEP and feels like she is able to continue her strenthening independently at home. She would like to discharge after today's visit. No specific questions or concerns upon arrival.    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 Pain started at work when she bent over to pick up a heavy tray. Pain initially was localized in the R lower back and posterior hip. Since onset pain has been intermittent in nature. She went to a chiropractor years ago which helped and she also uses Biofreeze to ease the pain. She went to  see Dr. Sharlet Salina years ago who tried injections however after insufficient relief she was referred to Dr. Marry Guan secondary to persistent L hip pain. Pt underwent a L posterior approach THR which did alleviate a significant amount of pain. Currently pain worsens with standing and walking and occasionally with prolonged sitting. However position of comfort is sitting. Prolonged stationary standing is the worst. She uses a cane intermittently which helps as well. Currently she feels the pain in the R buttock, down the outside of her R thigh and into her R calf. Symptoms are no longer extending into the foot as they had been  previously. She took an oral prednisone taper for bronchitis recently which provided moderate relief for her back pain. She is also taking gabapentin which has been helpful and was recently increased. No formal physical therapy for this issue. She underwent a R L5-S1 and R S1 epidural steroid injection on 05/13/21 which provided helpful. Referral was also for cervical DDD and radiculopathy. Pt started having neck pain on 05/13/21 which was causing radicular symptoms down her RUE. She has had significant improvement in symptoms since that time and currently only experiences some tighness in her neck. At this time she reports that neck pain is pretty much gone and she would like to focus on her back pain.    Diagnostic tests See history    Patient Stated Goals To be able to ride longer in the car so she can visit her grandson.                  TREATMENT   Ther-ex NuStep L0-2 x 5 minutes during interval history and goal update; mODI: 10% FOTO: 56 Worst pain: 4/10; Percent improvement: 25% HEP modified, reviewed, and discharge instructions provided;   Manual Therapy Prone STM using theraband roller and Hypervolt to R lumbar paraspinals, right glute max, and right glute med;  Prone CPA and R UPA L1-L5 grade I-II, 30s/bout x 1 bouts/level each;     Pt educated throughout session about proper posture and technique with exercises. Improved exercise technique, movement at target joints, use of target muscles after min to mod verbal, visual, tactile cues.    Patient demonstrates excellent motivation during session today. Continued manual techniques for lower lumbar spine and right posterior hip. She states that she would like to discharge today so she can continue her HEP independently.  Outcome measures and goals updated with patient.  She reports approximately 25% improvement in symptoms since starting with therapy.  Her Modified Oswestry Disability Index score decreased from 28% at initial  evaluation to 10% today.  She reports improvement in duration of time she can ride in the car without experiencing discomfort.  HEP updated with patient today and discharge instructions provided.                    PT Short Term Goals - 07/02/21 1153       PT SHORT TERM GOAL #1   Title Pt will be independent with HEP in order to improve strength and decrease back pain in order to improve pain-free function at home    Time 6    Period Weeks    Status Achieved               PT Long Term Goals - 07/02/21 1154       PT LONG TERM GOAL #1   Title Pt will decrease mODI score by at least 13 points in order demonstrate clinically significant  reduction in back pain/disability.    Baseline 06/18/21: To complete at next visit; 06/23/20: 28%; 07/02/21: 10%    Time 12    Period Weeks    Status Achieved      PT LONG TERM GOAL #2   Title Pt will decrease worst back pain as reported on NPRS by at least 2 points in order to demonstrate clinically significant reduction in back pain.    Baseline 06/18/21: Worst: 4/10; 07/02/21: 4/10;    Time 12    Period Weeks    Status Not Met      PT LONG TERM GOAL #3   Title Pt will increase FOTO to at least 62 in order to demonstrate significant improvement in function related to back pain    Baseline 06/18/21: 56; 07/02/21: 56    Time 12    Period Weeks    Status Not Met      PT LONG TERM GOAL #4   Title Pt will report at least 50% improvement in her symptoms related to her low back in order to be able to ride in the car for at least 2 hours in order to be able to ride in the car long enough to visit her grandson    Baseline 06/18/21: can sit for 1 hours; 07/02/21: symptoms improved by 25%    Time 12    Period Weeks    Status Partially Met                   Plan - 07/02/21 1213     Clinical Impression Statement Patient demonstrates excellent motivation during session today. Continued manual techniques for lower lumbar spine and  right posterior hip. She states that she would like to discharge today so she can continue her HEP independently.  Outcome measures and goals updated with patient.  She reports approximately 25% improvement in symptoms since starting with therapy.  Her Modified Oswestry Disability Index score decreased from 28% at initial evaluation to 10% today.  She reports improvement in duration of time she can ride in the car without experiencing discomfort.  HEP updated with patient today and discharge instructions provided.    Personal Factors and Comorbidities Age;Comorbidity 3+;Past/Current Experience;Time since onset of injury/illness/exacerbation;Fitness    Comorbidities DM, HTN, spinal stenosis    Examination-Activity Limitations Bend;Lift;Sit;Stand    Examination-Participation Restrictions Community Activity;Driving;Meal Prep    Stability/Clinical Decision Making Unstable/Unpredictable    Rehab Potential Fair    PT Frequency 2x / week    PT Duration 12 weeks    PT Treatment/Interventions ADLs/Self Care Home Management;Aquatic Therapy;Biofeedback;Canalith Repostioning;Cryotherapy;Electrical Stimulation;Iontophoresis 22m/ml Dexamethasone;Traction;Moist Heat;Ultrasound;Gait training;Functional mobility training;Therapeutic activities;Therapeutic exercise;Neuromuscular re-education;Manual techniques;Passive range of motion;Dry needling;Vestibular;Spinal Manipulations;Joint Manipulations    PT Next Visit Plan Discharge    PT Home Exercise Plan Access Code: [email or web address]   Consulted and Agree with Plan of Care Patient               Patient will benefit from skilled therapeutic intervention in order to improve the following deficits and impairments:  Decreased range of motion, Decreased strength, Pain  Visit Diagnosis: Chronic right-sided low back pain with right-sided sciatica     Problem List Patient Active Problem List   Diagnosis Date Noted   Atypical endometrial hyperplasia 08/15/2020    Post-operative state 08/15/2020   JPhillips GroutPT, DPT, GCS  Selia Wareing, PT 07/02/2021, 1:55 PM  [place] [facility] [place] [address]Phone: [phone]  Fax:  [phone]  Name: Amanda Watson MRN: [id number] Date of Birth: 06/29/1951

## 2021-07-02 NOTE — Patient Instructions (Signed)
Access Code: [email or web address] URL: [email or web address] Date: 07/02/2021 Prepared by: Roxana Hires  Exercises Hooklying Lumbar Rotation - 2 x daily - 7 x weekly - 2 sets - 10 reps - 5s hold Supine Pelvic Tilt - 2 x daily - 7 x weekly - 2 sets - 10 reps - 5s hold Seated Flexion Stretch - 2 x daily - 7 x weekly - 2 sets - 10 reps - 30s hold Seated Hamstring Stretch - 2 x daily - 7 x weekly - 3 reps - 30s hold Supine Bridge - 1 x daily - 7 x weekly - 2 sets - 10 reps - 3s hold Hooklying Clamshell with Resistance - 1 x daily - 7 x weekly - 2 sets - 10 reps - 3s hold

## 2021-10-01 DIAGNOSIS — G8929 Other chronic pain: Secondary | ICD-10-CM | POA: Insufficient documentation

## 2021-10-27 ENCOUNTER — Other Ambulatory Visit: Payer: Self-pay | Admitting: Family Medicine

## 2021-10-27 DIAGNOSIS — Z1231 Encounter for screening mammogram for malignant neoplasm of breast: Secondary | ICD-10-CM

## 2021-11-26 ENCOUNTER — Encounter: Payer: Self-pay | Admitting: Ophthalmology

## 2021-11-26 ENCOUNTER — Ambulatory Visit
Admission: RE | Admit: 2021-11-26 | Discharge: 2021-11-26 | Disposition: A | Discharge status: Home / Self Care | Payer: Medicare PPO | Source: Ambulatory Visit | Attending: Family Medicine | Admitting: Family Medicine

## 2021-11-26 ENCOUNTER — Other Ambulatory Visit: Payer: Self-pay

## 2021-11-26 DIAGNOSIS — Z1231 Encounter for screening mammogram for malignant neoplasm of breast: Secondary | ICD-10-CM | POA: Diagnosis not present

## 2021-11-27 NOTE — Discharge Instructions (Signed)

## 2021-12-01 ENCOUNTER — Encounter
Admission: RE | Disposition: A | Discharge status: Home / Self Care | Payer: Self-pay | Source: Ambulatory Visit | Attending: Ophthalmology

## 2021-12-01 ENCOUNTER — Ambulatory Visit: Payer: Medicare PPO | Admitting: Anesthesiology

## 2021-12-01 ENCOUNTER — Ambulatory Visit
Admission: RE | Admit: 2021-12-01 | Discharge: 2021-12-01 | Disposition: A | Discharge status: Home / Self Care | Payer: Medicare PPO | Source: Ambulatory Visit | Attending: Ophthalmology | Admitting: Ophthalmology

## 2021-12-01 ENCOUNTER — Other Ambulatory Visit: Payer: Self-pay

## 2021-12-01 ENCOUNTER — Encounter: Payer: Self-pay | Admitting: Ophthalmology

## 2021-12-01 DIAGNOSIS — Z8541 Personal history of malignant neoplasm of cervix uteri: Secondary | ICD-10-CM | POA: Diagnosis not present

## 2021-12-01 DIAGNOSIS — E1136 Type 2 diabetes mellitus with diabetic cataract: Secondary | ICD-10-CM | POA: Insufficient documentation

## 2021-12-01 DIAGNOSIS — G4730 Sleep apnea, unspecified: Secondary | ICD-10-CM | POA: Insufficient documentation

## 2021-12-01 DIAGNOSIS — I10 Essential (primary) hypertension: Secondary | ICD-10-CM | POA: Diagnosis not present

## 2021-12-01 DIAGNOSIS — K219 Gastro-esophageal reflux disease without esophagitis: Secondary | ICD-10-CM | POA: Insufficient documentation

## 2021-12-01 DIAGNOSIS — Z87891 Personal history of nicotine dependence: Secondary | ICD-10-CM | POA: Insufficient documentation

## 2021-12-01 DIAGNOSIS — H2512 Age-related nuclear cataract, left eye: Secondary | ICD-10-CM | POA: Diagnosis present

## 2021-12-01 DIAGNOSIS — J45909 Unspecified asthma, uncomplicated: Secondary | ICD-10-CM | POA: Diagnosis not present

## 2021-12-01 HISTORY — PX: CATARACT EXTRACTION W/PHACO: SHX586

## 2021-12-01 LAB — GLUCOSE, CAPILLARY
Glucose-Capillary: 130 mg/dL — ABNORMAL HIGH (ref 70–99)
Glucose-Capillary: 121 mg/dL — ABNORMAL HIGH (ref 70–99)

## 2021-12-01 SURGERY — PHACOEMULSIFICATION, CATARACT, WITH IOL INSERTION
Anesthesia: Monitor Anesthesia Care | Site: Eye | Laterality: Left

## 2021-12-01 MED ORDER — TETRACAINE HCL 0.5 % OP SOLN
1.0000 [drp] | OPHTHALMIC | Status: DC | PRN
  Administered 2021-12-01 (×3): 1 [drp] via OPHTHALMIC

## 2021-12-01 MED ORDER — EPINEPHRINE PF 1 MG/ML IJ SOLN
INTRAMUSCULAR | Status: DC | PRN
  Administered 2021-12-01: 57 mL via OPHTHALMIC

## 2021-12-01 MED ORDER — ONDANSETRON HCL 4 MG/2ML IJ SOLN: 4.0000 mg | Freq: Once | INTRAMUSCULAR | Status: DC | PRN

## 2021-12-01 MED ORDER — MOXIFLOXACIN HCL 0.5 % OP SOLN
OPHTHALMIC | Status: DC | PRN
  Administered 2021-12-01: 0.2 mL via OPHTHALMIC

## 2021-12-01 MED ORDER — ARMC OPHTHALMIC DILATING DROPS
1.0000 "application " | OPHTHALMIC | Status: DC | PRN
  Administered 2021-12-01 (×3): 1 via OPHTHALMIC

## 2021-12-01 MED ORDER — ACETAMINOPHEN 160 MG/5ML PO SOLN: 975.0000 mg | Freq: Once | ORAL | Status: DC | PRN

## 2021-12-01 MED ORDER — LIDOCAINE HCL (PF) 2 % IJ SOLN
INTRAMUSCULAR | Status: DC | PRN
  Administered 2021-12-01: 1 mL via INTRAOCULAR

## 2021-12-01 MED ORDER — LACTATED RINGERS IV SOLN: INTRAVENOUS | Status: DC

## 2021-12-01 MED ORDER — SIGHTPATH DOSE#1 BSS IO SOLN
INTRAOCULAR | Status: DC | PRN
  Administered 2021-12-01: 15 mL

## 2021-12-01 MED ORDER — ACETAMINOPHEN 500 MG PO TABS: 1000.0000 mg | ORAL_TABLET | Freq: Once | ORAL | Status: DC | PRN

## 2021-12-01 MED ORDER — FENTANYL CITRATE (PF) 100 MCG/2ML IJ SOLN
INTRAMUSCULAR | Status: DC | PRN
  Administered 2021-12-01: 50 ug via INTRAVENOUS

## 2021-12-01 MED ORDER — MIDAZOLAM HCL 2 MG/2ML IJ SOLN
INTRAMUSCULAR | Status: DC | PRN
  Administered 2021-12-01: 1 mg via INTRAVENOUS

## 2021-12-01 MED ORDER — SIGHTPATH DOSE#1 SODIUM HYALURONATE 10 MG/ML IO SOLUTION
PREFILLED_SYRINGE | INTRAOCULAR | Status: DC | PRN
  Administered 2021-12-01: 0.85 mL via INTRAOCULAR

## 2021-12-01 MED ORDER — SIGHTPATH DOSE#1 SODIUM HYALURONATE 23 MG/ML IO SOLUTION
PREFILLED_SYRINGE | INTRAOCULAR | Status: DC | PRN
  Administered 2021-12-01: 0.6 mL via INTRAOCULAR

## 2021-12-01 SURGICAL SUPPLY — 20 items
CANNULA ANT/CHMB 27G (MISCELLANEOUS) IMPLANT
CANNULA ANT/CHMB 27GA (MISCELLANEOUS) IMPLANT
CATARACT SUITE SIGHTPATH (MISCELLANEOUS) ×2 IMPLANT
DISSECTOR HYDRO NUCLEUS 50X22 (MISCELLANEOUS) ×2 IMPLANT
FEE CATARACT SUITE SIGHTPATH (MISCELLANEOUS) ×1 IMPLANT
GLOVE SURG GAMMEX PI TX LF 7.5 (GLOVE) ×2 IMPLANT
GLOVE SURG SYN 8.5  E (GLOVE) ×1
GLOVE SURG SYN 8.5 E (GLOVE) ×1 IMPLANT
GLOVE SURG SYN 8.5 PF PI (GLOVE) ×1 IMPLANT
LENS IOL TECNIS EYHANCE 23.5 (Intraocular Lens) ×1 IMPLANT
NDL FILTER BLUNT 18X1 1/2 (NEEDLE) ×1 IMPLANT
NEEDLE FILTER BLUNT 18X 1/2SAF (NEEDLE) ×1
NEEDLE FILTER BLUNT 18X1 1/2 (NEEDLE) ×1 IMPLANT
PACK VIT ANT 23G (MISCELLANEOUS) IMPLANT
RING MALYGIN (MISCELLANEOUS) IMPLANT
SUT ETHILON 10-0 CS-B-6CS-B-6 (SUTURE)
SUTURE EHLN 10-0 CS-B-6CS-B-6 (SUTURE) IMPLANT
SYR 3ML LL SCALE MARK (SYRINGE) ×2 IMPLANT
SYR 5ML LL (SYRINGE) ×2 IMPLANT
WATER STERILE IRR 250ML POUR (IV SOLUTION) ×2 IMPLANT

## 2021-12-01 NOTE — H&P (Signed)
[facility]   Primary Care Physician:  Juluis Pitch, MD Ophthalmologist: Dr. Benay Pillow  Pre-Procedure History & Physical: HPI:  Amanda Watson is a 71 y.o. female here for cataract surgery.   Past Medical History:  Diagnosis Date   Anemia    Asthma    Cancer ([place]) 1999   cervical ca   Diabetes mellitus without complication (HCC)    GERD (gastroesophageal reflux disease)    History of Bell's palsy    Hypercholesteremia    Hyperlipidemia    Hypertension    Multinodular goiter    Psoriasis    Sleep apnea    uses CPAP   Spinal stenosis     Past Surgical History:  Procedure Laterality Date   BROW LIFT Bilateral 03/22/2018   Procedure: BLEPHAROPLASTY UPPER EYELID WITH EXCESS SKIN DIABETIC;  Surgeon: Karle Starch, MD;  Location: [facility];  Service: Ophthalmology;  Laterality: Bilateral;  DIABETIC-oral meds   CARPAL TUNNEL RELEASE Bilateral    CESAREAN SECTION  09/1981   CHOLECYSTECTOMY  1983   COLONOSCOPY WITH PROPOFOL N/A 12/17/2020   Procedure: COLONOSCOPY WITH PROPOFOL;  Surgeon: Lesly Rubenstein, MD;  Location: [facility];  Service: Endoscopy;  Laterality: N/A;   EYE SURGERY Bilateral 2018   eyelid surgery   HYSTERECTOMY ABDOMINAL WITH SALPINGECTOMY Bilateral 08/15/2020   Procedure: HYSTERECTOMY ABDOMINAL WITH BILATERAL  SALPINGO OOPHORECTOMY;  Surgeon: Schermerhorn, Gwen Her, MD;  Location: [facility];  Service: Gynecology;  Laterality: Bilateral;   HYSTEROSCOPY WITH D & C N/A 06/14/2020   Procedure: FRACTIONAL DILATATION AND CURETTAGE /HYSTEROSCOPY;  Surgeon: Schermerhorn, Gwen Her, MD;  Location: [facility];  Service: Gynecology;  Laterality: N/A;   JOINT REPLACEMENT Left    hip   TOTAL HIP ARTHROPLASTY Left    UMBILICAL HERNIA REPAIR N/A 06/14/2015   Procedure: HERNIA REPAIR UMBILICAL ADULT;  Surgeon: Leonie Green, MD;  Location: [facility];  Service: General;  Laterality: N/A;    Prior to Admission medications   Medication Sig Start Date  End Date Taking? Authorizing Provider  albuterol (PROVENTIL HFA;VENTOLIN HFA) 108 (90 BASE) MCG/ACT inhaler Inhale 1 puff into the lungs every 6 (six) hours as needed for wheezing or shortness of breath.   Yes [provider]  aspirin 81 MG tablet Take 81 mg by mouth daily.    Yes [provider]  Azelastine HCl 137 MCG/SPRAY SOLN Place 1 spray into both nostrils daily as needed (allergies).  02/05/20  Yes [provider]  benzonatate (TESSALON) 200 MG capsule Take 200 mg by mouth 3 (three) times daily as needed for cough.   Yes [provider]  Calcium Carb-Cholecalciferol (CALCIUM 500 +D PO) Take by mouth.   Yes [provider]  cholecalciferol (VITAMIN D3) 25 MCG (1000 UNIT) tablet Take 1,000 Units by mouth daily.   Yes [provider]  docusate sodium (COLACE) 100 MG capsule Take 200 mg by mouth 2 (two) times daily.    Yes [provider]  ferrous sulfate 324 (65 Fe) MG TBEC Take by mouth.   Yes [provider]  fexofenadine (ALLEGRA) 180 MG tablet Take 180 mg by mouth daily.   Yes [provider]  Fluticasone-Salmeterol (ADVAIR) 250-50 MCG/DOSE AEPB Inhale 1 puff into the lungs 2 (two) times daily as needed (shortness of breath).    Yes [provider]  gabapentin (NEURONTIN) 300 MG capsule Take 1 capsule (300 mg total) by mouth at bedtime for 15 days. 08/17/20 12/01/21 Yes Schermerhorn, Gwen Her,  MD  Magnesium 400 MG TABS Take 400 mg by mouth daily.    Yes [provider]  metFORMIN (GLUCOPHAGE) 1000 MG tablet Take 1,000 mg by mouth 2 (two) times daily with a meal.   Yes [provider]  mometasone (NASONEX) 50 MCG/ACT nasal spray Place 2 sprays into the nose daily.   Yes [provider]  montelukast (SINGULAIR) 10 MG tablet Take 10 mg by mouth at bedtime.   Yes [provider]  niacin (NIASPAN) 500 MG CR tablet Take 500 mg by mouth at bedtime.    Yes [provider]  Omega-3 Fatty Acids (FISH OIL) 1000 MG CAPS Take 2,000 mg by mouth daily.    Yes [provider]  omeprazole (PRILOSEC) 20 MG capsule Take 20 mg by mouth daily.   Yes [provider]  ondansetron (ZOFRAN) 8 MG tablet Take 1 tablet (8 mg total) by mouth every 8 (eight) hours as needed for nausea or vomiting. 08/17/20  Yes Schermerhorn, Gwen Her, MD  potassium gluconate 595 (99 K) MG TABS tablet Take 99 mg by mouth daily.    Yes [provider]  simvastatin (ZOCOR) 40 MG tablet Take 40 mg by mouth every evening.   Yes [provider]  telmisartan-hydrochlorothiazide (MICARDIS HCT) 40-12.5 MG per tablet Take 0.5 tablets by mouth daily.    Yes [provider]  vitamin B-12 (CYANOCOBALAMIN) 1000 MCG tablet Take 1,000 mcg by mouth daily.   Yes [provider]  acidophilus (RISAQUAD) CAPS capsule Take 1 capsule by mouth daily.  Patient not taking: Reported on 12/17/2020    [provider]  simethicone (GAS-X) 80 MG chewable tablet Chew 1 tablet (80 mg total) by mouth 4 (four) times daily as needed for flatulence. 08/17/20 08/17/21  Schermerhorn, Gwen Her, MD    Allergies as of 11/12/2021 - Review Complete 07/02/2021  Allergen Reaction Noted   Levaquin [levofloxacin] Nausea And Vomiting 06/12/2015    Family History  Problem Relation Age of Onset   Breast cancer Sister 50    Social History   Socioeconomic History   Marital status: Married    Spouse name: Not on file   Number of children: Not on file   Years of education: Not on file   Highest education level: Not on file  Occupational History   Not on file  Tobacco Use   Smoking status: Former    Types: Cigarettes    Quit date: 1999    Years since quitting: 24.1   Smokeless tobacco: Never  Vaping Use   Vaping Use: Never used  Substance and Sexual Activity   Alcohol use: No   Drug use: No   Sexual activity: Not on file  Other Topics Concern   Not on file  Social  History Narrative   Not on file   Social Determinants of Health   Financial Resource Strain: Not on file  Food Insecurity: Not on file  Transportation Needs: Not on file  Physical Activity: Not on file  Stress: Not on file  Social Connections: Not on file  Intimate Partner Violence: Not on file    Review of Systems: See HPI, otherwise negative ROS  Physical Exam: BP 137/63    Pulse 77    Temp 97.9 F (36.6 C) (Temporal)    Ht 5' (1.524 m)    Wt 101.2 kg    SpO2 97%    BMI 43.55 kg/m  General:   Alert, cooperative in NAD Head:  Normocephalic  and atraumatic. Respiratory:  Normal work of breathing. Cardiovascular:  RRR  Impression/Plan: Amanda Watson is here for cataract surgery.  Risks, benefits, limitations, and alternatives regarding cataract surgery have been reviewed with the patient.  Questions have been answered.  All parties agreeable.   Benay Pillow, MD  12/01/2021, 9:56 AM

## 2021-12-01 NOTE — Anesthesia Preprocedure Evaluation (Signed)
Anesthesia Evaluation  Patient identified by MRN, date of birth, ID band Patient awake    Reviewed: Allergy & Precautions, H&P , NPO status , Patient's Chart, lab work & pertinent test results, reviewed documented beta blocker date and time   Airway Mallampati: II  TM Distance: >3 FB Neck ROM: full    Dental no notable dental hx.    Pulmonary neg pulmonary ROS, asthma , sleep apnea , former smoker,    Pulmonary exam normal breath sounds clear to auscultation       Cardiovascular Exercise Tolerance: Good hypertension, negative cardio ROS   Rhythm:regular Rate:Normal     Neuro/Psych H/o bell's palsy negative neurological ROS  negative psych ROS   GI/Hepatic negative GI ROS, Neg liver ROS, GERD  ,  Endo/Other  negative endocrine ROSdiabetes  Renal/GU negative Renal ROS  Female GU complaint (H/o cervical ca)  negative genitourinary   Musculoskeletal   Abdominal   Peds  Hematology negative hematology ROS (+)   Anesthesia Other Findings BMI 42  Reproductive/Obstetrics negative OB ROS                             Anesthesia Physical Anesthesia Plan  ASA: 3  Anesthesia Plan: MAC   Post-op Pain Management:    Induction:   PONV Risk Score and Plan: 2 and TIVA, Midazolam and Treatment may vary due to age or medical condition  Airway Management Planned:   Additional Equipment:   Intra-op Plan:   Post-operative Plan:   Informed Consent: I have reviewed the patients History and Physical, chart, labs and discussed the procedure including the risks, benefits and alternatives for the proposed anesthesia with the patient or authorized representative who has indicated his/her understanding and acceptance.     Dental Advisory Given  Plan Discussed with: CRNA  Anesthesia Plan Comments:         Anesthesia Quick Evaluation

## 2021-12-01 NOTE — Op Note (Signed)
OPERATIVE NOTE  Amanda Watson [id number] 12/01/2021   PREOPERATIVE DIAGNOSIS:  Nuclear sclerotic cataract left eye.  H25.12   POSTOPERATIVE DIAGNOSIS:    Nuclear sclerotic cataract left eye.     PROCEDURE:  Phacoemusification with posterior chamber intraocular lens placement of the left eye   LENS:   Implant Name Type Inv. Item Serial No. Manufacturer Lot No. LRB No. Used Action  LENS IOL TECNIS EYHANCE 23.5 - [id number] Intraocular Lens LENS IOL TECNIS EYHANCE 23.5 [id number] [employer]  Left 1 Implanted      Procedure(s) with comments: CATARACT EXTRACTION PHACO AND INTRAOCULAR LENS PLACEMENT (IOC) LEFT DIABETIC (Left) - 2.16 0:26.4  DIB00 +23.5   ULTRASOUND TIME: 0 minutes 26 seconds.  CDE 2.16   SURGEON:  Benay Pillow, MD, MPH   ANESTHESIA:  Topical with tetracaine drops augmented with 1% preservative-free intracameral lidocaine.  ESTIMATED BLOOD LOSS: <1 mL   COMPLICATIONS:  None.   DESCRIPTION OF PROCEDURE:  The patient was identified in the holding room and transported to the operating room and placed in the supine position under the operating microscope.  The left eye was identified as the operative eye and it was prepped and draped in the usual sterile ophthalmic fashion.   A 1.0 millimeter clear-corneal paracentesis was made at the 5:00 position. 0.5 ml of preservative-free 1% lidocaine with epinephrine was injected into the anterior chamber.  The anterior chamber was filled with Healon 5 viscoelastic.  A 2.4 millimeter keratome was used to make a near-clear corneal incision at the 2:00 position.  A curvilinear capsulorrhexis was made with a cystotome and capsulorrhexis forceps.  Balanced salt solution was used to hydrodissect and hydrodelineate the nucleus.   Phacoemulsification was then used in stop and chop fashion to remove the lens nucleus and epinucleus.  The remaining cortex was then removed using the irrigation and aspiration handpiece. Healon was then placed into  the capsular bag to distend it for lens placement.  A lens was then injected into the capsular bag.  The remaining viscoelastic was aspirated.   Wounds were hydrated with balanced salt solution.  The anterior chamber was inflated to a physiologic pressure with balanced salt solution.  Intracameral vigamox 0.1 mL undiltued was injected into the eye and a drop placed onto the ocular surface.  No wound leaks were noted.  The patient was taken to the recovery room in stable condition without complications of anesthesia or surgery  Benay Pillow 12/01/2021, 10:25 AM

## 2021-12-01 NOTE — Transfer of Care (Signed)
Immediate Anesthesia Transfer of Care Note  Patient: Amanda Watson  Procedure(s) Performed: CATARACT EXTRACTION PHACO AND INTRAOCULAR LENS PLACEMENT (IOC) LEFT DIABETIC (Left: Eye)  Patient Location: PACU  Anesthesia Type: MAC  Level of Consciousness: awake, alert  and patient cooperative  Airway and Oxygen Therapy: Patient Spontanous Breathing and Patient connected to supplemental oxygen  Post-op Assessment: Post-op Vital signs reviewed, Patient's Cardiovascular Status Stable, Respiratory Function Stable, Patent Airway and No signs of Nausea or vomiting  Post-op Vital Signs: Reviewed and stable  Complications: No notable events documented.

## 2021-12-01 NOTE — Anesthesia Postprocedure Evaluation (Signed)
Anesthesia Post Note  Patient: Amanda Watson  Procedure(s) Performed: CATARACT EXTRACTION PHACO AND INTRAOCULAR LENS PLACEMENT (IOC) LEFT DIABETIC (Left: Eye)     Patient location during evaluation: PACU Anesthesia Type: MAC Level of consciousness: awake and alert Pain management: pain level controlled Vital Signs Assessment: post-procedure vital signs reviewed and stable Respiratory status: spontaneous breathing, nonlabored ventilation and respiratory function stable Cardiovascular status: stable and blood pressure returned to baseline Postop Assessment: no apparent nausea or vomiting Anesthetic complications: no   No notable events documented.  April Manson

## 2021-12-02 ENCOUNTER — Encounter: Payer: Self-pay | Admitting: Ophthalmology

## 2021-12-10 NOTE — Discharge Instructions (Signed)

## 2021-12-15 ENCOUNTER — Ambulatory Visit: Payer: Medicare PPO | Admitting: Anesthesiology

## 2021-12-15 ENCOUNTER — Other Ambulatory Visit: Payer: Self-pay

## 2021-12-15 ENCOUNTER — Encounter: Payer: Self-pay | Admitting: Ophthalmology

## 2021-12-15 ENCOUNTER — Ambulatory Visit
Admission: RE | Admit: 2021-12-15 | Discharge: 2021-12-15 | Disposition: A | Discharge status: Home / Self Care | Payer: Medicare PPO | Source: Ambulatory Visit | Attending: Ophthalmology | Admitting: Ophthalmology

## 2021-12-15 ENCOUNTER — Encounter
Admission: RE | Disposition: A | Discharge status: Home / Self Care | Payer: Self-pay | Source: Ambulatory Visit | Attending: Ophthalmology

## 2021-12-15 DIAGNOSIS — J45909 Unspecified asthma, uncomplicated: Secondary | ICD-10-CM | POA: Insufficient documentation

## 2021-12-15 DIAGNOSIS — I10 Essential (primary) hypertension: Secondary | ICD-10-CM | POA: Diagnosis not present

## 2021-12-15 DIAGNOSIS — K219 Gastro-esophageal reflux disease without esophagitis: Secondary | ICD-10-CM | POA: Insufficient documentation

## 2021-12-15 DIAGNOSIS — Z8541 Personal history of malignant neoplasm of cervix uteri: Secondary | ICD-10-CM | POA: Insufficient documentation

## 2021-12-15 DIAGNOSIS — Z87891 Personal history of nicotine dependence: Secondary | ICD-10-CM | POA: Diagnosis not present

## 2021-12-15 DIAGNOSIS — G4730 Sleep apnea, unspecified: Secondary | ICD-10-CM | POA: Diagnosis not present

## 2021-12-15 DIAGNOSIS — H2511 Age-related nuclear cataract, right eye: Secondary | ICD-10-CM | POA: Insufficient documentation

## 2021-12-15 DIAGNOSIS — Z79899 Other long term (current) drug therapy: Secondary | ICD-10-CM | POA: Insufficient documentation

## 2021-12-15 DIAGNOSIS — E1136 Type 2 diabetes mellitus with diabetic cataract: Secondary | ICD-10-CM | POA: Insufficient documentation

## 2021-12-15 LAB — GLUCOSE, CAPILLARY
Glucose-Capillary: 140 mg/dL — ABNORMAL HIGH (ref 70–99)
Glucose-Capillary: 130 mg/dL — ABNORMAL HIGH (ref 70–99)

## 2021-12-15 SURGERY — PHACOEMULSIFICATION, CATARACT, WITH IOL INSERTION
Anesthesia: Monitor Anesthesia Care | Site: Eye | Laterality: Right

## 2021-12-15 MED ORDER — MIDAZOLAM HCL 2 MG/2ML IJ SOLN
INTRAMUSCULAR | Status: DC | PRN
  Administered 2021-12-15: 2 mg via INTRAVENOUS

## 2021-12-15 MED ORDER — MOXIFLOXACIN HCL 0.5 % OP SOLN
OPHTHALMIC | Status: DC | PRN
  Administered 2021-12-15: 0.2 mL via OPHTHALMIC

## 2021-12-15 MED ORDER — SIGHTPATH DOSE#1 BSS IO SOLN
INTRAOCULAR | Status: DC | PRN
  Administered 2021-12-15: 15 mL

## 2021-12-15 MED ORDER — FENTANYL CITRATE (PF) 100 MCG/2ML IJ SOLN
INTRAMUSCULAR | Status: DC | PRN
  Administered 2021-12-15: 50 ug via INTRAVENOUS

## 2021-12-15 MED ORDER — LACTATED RINGERS IV SOLN: INTRAVENOUS | Status: DC

## 2021-12-15 MED ORDER — SIGHTPATH DOSE#1 SODIUM HYALURONATE 23 MG/ML IO SOLUTION
PREFILLED_SYRINGE | INTRAOCULAR | Status: DC | PRN
  Administered 2021-12-15: 0.6 mL via INTRAOCULAR

## 2021-12-15 MED ORDER — SIGHTPATH DOSE#1 SODIUM HYALURONATE 10 MG/ML IO SOLUTION
PREFILLED_SYRINGE | INTRAOCULAR | Status: DC | PRN
  Administered 2021-12-15: 0.85 mL via INTRAOCULAR

## 2021-12-15 MED ORDER — ARMC OPHTHALMIC DILATING DROPS
1.0000 "application " | OPHTHALMIC | Status: DC | PRN
  Administered 2021-12-15 (×3): 1 via OPHTHALMIC

## 2021-12-15 MED ORDER — TETRACAINE HCL 0.5 % OP SOLN
1.0000 [drp] | OPHTHALMIC | Status: DC | PRN
  Administered 2021-12-15 (×3): 1 [drp] via OPHTHALMIC

## 2021-12-15 MED ORDER — ACETAMINOPHEN 160 MG/5ML PO SOLN: 325.0000 mg | ORAL | Status: DC | PRN

## 2021-12-15 MED ORDER — LIDOCAINE HCL (PF) 2 % IJ SOLN
INTRAOCULAR | Status: DC | PRN
  Administered 2021-12-15: 1 mL via INTRAOCULAR

## 2021-12-15 MED ORDER — EPINEPHRINE PF 1 MG/ML IJ SOLN
INTRAMUSCULAR | Status: DC | PRN
  Administered 2021-12-15: 66 mL via OPHTHALMIC

## 2021-12-15 MED ORDER — ACETAMINOPHEN 325 MG PO TABS
325.0000 mg | ORAL_TABLET | ORAL | Status: DC | PRN
  Administered 2021-12-15: 650 mg via ORAL

## 2021-12-15 SURGICAL SUPPLY — 14 items
CATARACT SUITE SIGHTPATH (MISCELLANEOUS) ×2 IMPLANT
DISSECTOR HYDRO NUCLEUS 50X22 (MISCELLANEOUS) ×2 IMPLANT
FEE CATARACT SUITE SIGHTPATH (MISCELLANEOUS) ×1 IMPLANT
GLOVE SURG GAMMEX PI TX LF 7.5 (GLOVE) ×2 IMPLANT
GLOVE SURG SYN 8.5  E (GLOVE) ×1
GLOVE SURG SYN 8.5 E (GLOVE) ×1 IMPLANT
GLOVE SURG SYN 8.5 PF PI (GLOVE) ×1 IMPLANT
LENS IOL TECNIS EYHANCE 23.0 (Intraocular Lens) ×1 IMPLANT
NDL FILTER BLUNT 18X1 1/2 (NEEDLE) ×1 IMPLANT
NEEDLE FILTER BLUNT 18X 1/2SAF (NEEDLE) ×1
NEEDLE FILTER BLUNT 18X1 1/2 (NEEDLE) ×1 IMPLANT
SYR 3ML LL SCALE MARK (SYRINGE) ×2 IMPLANT
SYR 5ML LL (SYRINGE) ×2 IMPLANT
WATER STERILE IRR 250ML POUR (IV SOLUTION) ×2 IMPLANT

## 2021-12-15 NOTE — Transfer of Care (Signed)
Immediate Anesthesia Transfer of Care Note ? ?Patient: Amanda Watson ? ?Procedure(s) Performed: CATARACT EXTRACTION PHACO AND INTRAOCULAR LENS PLACEMENT (IOC) RIGHT DIABETIC 2.89 00:28.9 (Right: Eye) ? ?Patient Location: PACU ? ?Anesthesia Type: MAC ? ?Level of Consciousness: awake, alert  and patient cooperative ? ?Airway and Oxygen Therapy: Patient Spontanous Breathing and Patient connected to supplemental oxygen ? ?Post-op Assessment: Post-op Vital signs reviewed, Patient's Cardiovascular Status Stable, Respiratory Function Stable, Patent Airway and No signs of Nausea or vomiting ? ?Post-op Vital Signs: Reviewed and stable ? ?Complications: No notable events documented. ? ?

## 2021-12-15 NOTE — Op Note (Signed)
OPERATIVE NOTE ? ?Amanda Watson ?[id number] ?12/15/2021 ? ? ?PREOPERATIVE DIAGNOSIS:  Nuclear sclerotic cataract right eye.  H25.11 ?  ?POSTOPERATIVE DIAGNOSIS:    Nuclear sclerotic cataract right eye.   ?  ?PROCEDURE:  Phacoemusification with posterior chamber intraocular lens placement of the right eye  ? ?LENS:   ?Implant Name Type Inv. Item Serial No. Manufacturer Lot No. LRB No. Used Action  ?LENS IOL TECNIS EYHANCE 23.0 - [id number] Intraocular Lens LENS IOL TECNIS EYHANCE 23.0 [id number] [employer]  Right 1 Implanted  ?    ? ?Procedure(s) with comments: ?CATARACT EXTRACTION PHACO AND INTRAOCULAR LENS PLACEMENT (IOC) RIGHT DIABETIC 2.89 00:28.9 (Right) - Diabetic ? ?DIB00 +23.0 ?  ?ULTRASOUND TIME: 0 minutes 28 seconds.  CDE 2.89 ?  ?SURGEON:  Benay Pillow, MD, MPH ? ?ANESTHESIOLOGIST: Anesthesiologist: Page, Adele Barthel, MD ?CRNA: Silvana Newness, CRNA ?  ?ANESTHESIA:  Topical with tetracaine drops augmented with 1% preservative-free intracameral lidocaine. ? ?ESTIMATED BLOOD LOSS: less than 1 mL. ?  ?COMPLICATIONS:  None. ?  ?DESCRIPTION OF PROCEDURE:  The patient was identified in the holding room and transported to the operating room and placed in the supine position under the operating microscope.  The right eye was identified as the operative eye and it was prepped and draped in the usual sterile ophthalmic fashion. ?  ?A 1.0 millimeter clear-corneal paracentesis was made at the 10:30 position. 0.5 ml of preservative-free 1% lidocaine with epinephrine was injected into the anterior chamber. ? The anterior chamber was filled with Healon 5 viscoelastic.  A 2.4 millimeter keratome was used to make a near-clear corneal incision at the 8:00 position.  A curvilinear capsulorrhexis was made with a cystotome and capsulorrhexis forceps.  Balanced salt solution was used to hydrodissect and hydrodelineate the nucleus. ?  ?Phacoemulsification was then used in stop and chop fashion to remove the lens nucleus and epinucleus.   The remaining cortex was then removed using the irrigation and aspiration handpiece. Healon was then placed into the capsular bag to distend it for lens placement.  A lens was then injected into the capsular bag.  The remaining viscoelastic was aspirated. ?  ?Wounds were hydrated with balanced salt solution.  The anterior chamber was inflated to a physiologic pressure with balanced salt solution.  ? ?Intracameral vigamox 0.1 mL undiluted was injected into the eye and a drop placed onto the ocular surface. ? ?No wound leaks were noted.  The patient was taken to the recovery room in stable condition without complications of anesthesia or surgery ? ?Benay Pillow ?12/15/2021, 7:53 AM ? ?

## 2021-12-15 NOTE — Anesthesia Postprocedure Evaluation (Signed)
Anesthesia Post Note ? ?Patient: Amanda Watson ? ?Procedure(s) Performed: CATARACT EXTRACTION PHACO AND INTRAOCULAR LENS PLACEMENT (IOC) RIGHT DIABETIC 2.89 00:28.9 (Right: Eye) ? ? ?  ?Patient location during evaluation: PACU ?Anesthesia Type: MAC ?Level of consciousness: awake and alert ?Pain management: pain level controlled ?Vital Signs Assessment: post-procedure vital signs reviewed and stable ?Respiratory status: spontaneous breathing, nonlabored ventilation, respiratory function stable and patient connected to nasal cannula oxygen ?Cardiovascular status: stable and blood pressure returned to baseline ?Postop Assessment: no apparent nausea or vomiting ?Anesthetic complications: no ? ? ?No notable events documented. ? ?Adele Barthel Hank Walling ? ? ? ? ? ?

## 2021-12-15 NOTE — Anesthesia Preprocedure Evaluation (Signed)
Anesthesia Evaluation  ?Patient identified by MRN, date of birth, ID band ?Patient awake ? ? ? ?Reviewed: ?Allergy & Precautions, H&P , NPO status , Patient's Chart, lab work & pertinent test results, reviewed documented beta blocker date and time  ? ?History of Anesthesia Complications ?Negative for: history of anesthetic complications ? ?Airway ?Mallampati: III ? ?TM Distance: >3 FB ?Neck ROM: full ? ? ? Dental ?no notable dental hx. ? ?  ?Pulmonary ?neg pulmonary ROS, asthma , sleep apnea and Continuous Positive Airway Pressure Ventilation , former smoker,  ?  ?Pulmonary exam normal ?breath sounds clear to auscultation ? ? ? ? ? ? Cardiovascular ?Exercise Tolerance: Good ?hypertension, Pt. on medications ?negative cardio ROS ? ? ?Rhythm:regular Rate:Normal ? ? ?  ?Neuro/Psych ?H/o bell's palsy ?negative neurological ROS ? negative psych ROS  ? GI/Hepatic ?negative GI ROS, Neg liver ROS, GERD  ,  ?Endo/Other  ?negative endocrine ROSdiabetes, Well Controlled, Type 2 ? Renal/GU ?negative Renal ROS  ?Female GU complaint (H/o cervical ca) ? ?negative genitourinary ?  ?Musculoskeletal ? ? Abdominal ?  ?Peds ? Hematology ?negative hematology ROS ?(+)   ?Anesthesia Other Findings ?BMI 42 ? Reproductive/Obstetrics ?negative OB ROS ? ?  ? ? ? ? ? ? ? ? ? ? ? ? ? ?  ?  ? ? ? ? ? ? ? ? ?Anesthesia Physical ? ?Anesthesia Plan ? ?ASA: 3 ? ?Anesthesia Plan: MAC  ? ?Post-op Pain Management:   ? ?Induction:  ? ?PONV Risk Score and Plan: 2 and TIVA, Midazolam and Treatment may vary due to age or medical condition ? ?Airway Management Planned: Nasal Cannula ? ?Additional Equipment: None ? ?Intra-op Plan:  ? ?Post-operative Plan:  ? ?Informed Consent: I have reviewed the patients History and Physical, chart, labs and discussed the procedure including the risks, benefits and alternatives for the proposed anesthesia with the patient or authorized representative who has indicated his/her understanding  and acceptance.  ? ? ? ? ? ?Plan Discussed with: CRNA ? ?Anesthesia Plan Comments:   ? ? ? ? ? ? ?Anesthesia Quick Evaluation ? ?

## 2021-12-15 NOTE — H&P (Signed)
[place]  ? ?Primary Care Physician:  Juluis Pitch, MD ?Ophthalmologist: Dr. Benay Pillow ? ?Pre-Procedure History & Physical: ?HPI:  Amanda Watson is a 71 y.o. female here for cataract surgery. ?  ?Past Medical History:  ?Diagnosis Date  ? Anemia   ? Asthma   ? Cancer [facility]) 1999  ? cervical ca  ? Diabetes mellitus without complication ([place])   ? GERD (gastroesophageal reflux disease)   ? History of Bell's palsy   ? Hypercholesteremia   ? Hyperlipidemia   ? Hypertension   ? Multinodular goiter   ? Psoriasis   ? Sleep apnea   ? uses CPAP  ? Spinal stenosis   ? ? ?Past Surgical History:  ?Procedure Laterality Date  ? BROW LIFT Bilateral 03/22/2018  ? Procedure: BLEPHAROPLASTY UPPER EYELID WITH EXCESS SKIN DIABETIC;  Surgeon: Karle Starch, MD;  Location: [place];  Service: Ophthalmology;  Laterality: Bilateral;  DIABETIC-oral meds  ? CARPAL TUNNEL RELEASE Bilateral   ? CATARACT EXTRACTION W/PHACO Left 12/01/2021  ? Procedure: CATARACT EXTRACTION PHACO AND INTRAOCULAR LENS PLACEMENT ([place]) LEFT DIABETIC;  Surgeon: Eulogio Bear, MD;  Location: [place];  Service: Ophthalmology;  Laterality: Left;  2.16 ?0:26.4  ? CESAREAN SECTION  09/1981  ? CHOLECYSTECTOMY  1983  ? COLONOSCOPY WITH PROPOFOL N/A 12/17/2020  ? Procedure: COLONOSCOPY WITH PROPOFOL;  Surgeon: Lesly Rubenstein, MD;  Location: [facility] ENDOSCOPY;  Service: Endoscopy;  Laterality: N/A;  ? EYE SURGERY Bilateral 2018  ? eyelid surgery  ? HYSTERECTOMY ABDOMINAL WITH SALPINGECTOMY Bilateral 08/15/2020  ? Procedure: HYSTERECTOMY ABDOMINAL WITH BILATERAL  SALPINGO OOPHORECTOMY;  Surgeon: Schermerhorn, Gwen Her, MD;  Location: [facility] ORS;  Service: Gynecology;  Laterality: Bilateral;  ? HYSTEROSCOPY WITH D & C N/A 06/14/2020  ? Procedure: FRACTIONAL DILATATION AND CURETTAGE /HYSTEROSCOPY;  Surgeon: Schermerhorn, Gwen Her, MD;  Location: [facility];  Service: Gynecology;  Laterality: N/A;  ? JOINT REPLACEMENT Left   ? hip  ? TOTAL HIP  ARTHROPLASTY Left   ? UMBILICAL HERNIA REPAIR N/A 06/14/2015  ? Procedure: HERNIA REPAIR UMBILICAL ADULT;  Surgeon: Leonie Green, MD;  Location: [facility] OR[facility];  Service: General;  Laterality: N/A;  ? ? ?Prior to Admission medications   ?Medication Sig Start Date End Date Taking? Authorizing Provider  ?albuterol (PROVENTIL HFA;VENTOLIN HFA) 108 (90 BASE) MCG/ACT inhaler Inhale 1 puff into the lungs every 6 (six) hours as needed for wheezing or shortness of breath.   Yes [provider]  ?aspirin 81 MG tablet Take 81 mg by mouth daily.    Yes [provider]  ?Azelastine HCl 137 MCG/SPRAY SOLN Place 1 spray into both nostrils daily as needed (allergies).  02/05/20  Yes [provider]  ?benzonatate (TESSALON) 200 MG capsule Take 200 mg by mouth 3 (three) times daily as needed for cough.   Yes [provider]  ?Calcium Carb-Cholecalciferol (CALCIUM 500 +D PO) Take by mouth.   Yes [provider]  ?cholecalciferol (VITAMIN D3) 25 MCG (1000 UNIT) tablet Take 1,000 Units by mouth daily.   Yes [provider]  ?docusate sodium (COLACE) 100 MG capsule Take 200 mg by mouth 2 (two) times daily.    Yes [provider]  ?ferrous sulfate 324 (65 Fe) MG TBEC Take by mouth.   Yes [provider]  ?fexofenadine (ALLEGRA) 180 MG tablet Take 180 mg by mouth daily.   Yes [provider]  ?Fluticasone-Salmeterol (ADVAIR) 250-50 MCG/DOSE AEPB Inhale 1 puff into the lungs 2 (  two) times daily as needed (shortness of breath).    Yes [provider]  ?gabapentin (NEURONTIN) 300 MG capsule Take 1 capsule (300 mg total) by mouth at bedtime for 15 days. 08/17/20 12/15/21 Yes Schermerhorn, Gwen Her, MD  ?Magnesium 400 MG TABS Take 400 mg by mouth daily.    Yes [provider]  ?metFORMIN (GLUCOPHAGE) 1000 MG tablet Take 1,000 mg by mouth 2 (two) times daily with a meal.   Yes [provider]  ?mometasone (NASONEX) 50 MCG/ACT nasal spray  Place 2 sprays into the nose daily.   Yes [provider]  ?montelukast (SINGULAIR) 10 MG tablet Take 10 mg by mouth at bedtime.   Yes [provider]  ?niacin (NIASPAN) 500 MG CR tablet Take 500 mg by mouth at bedtime.    Yes [provider]  ?Omega-3 Fatty Acids (FISH OIL) 1000 MG CAPS Take 2,000 mg by mouth daily.    Yes [provider]  ?omeprazole (PRILOSEC) 20 MG capsule Take 20 mg by mouth daily.   Yes [provider]  ?ondansetron (ZOFRAN) 8 MG tablet Take 1 tablet (8 mg total) by mouth every 8 (eight) hours as needed for nausea or vomiting. 08/17/20  Yes Schermerhorn, Gwen Her, MD  ?potassium gluconate 595 (99 K) MG TABS tablet Take 99 mg by mouth daily.    Yes [provider]  ?simethicone (GAS-X) 80 MG chewable tablet Chew 1 tablet (80 mg total) by mouth 4 (four) times daily as needed for flatulence. 08/17/20 12/15/21 Yes Schermerhorn, Gwen Her, MD  ?simvastatin (ZOCOR) 40 MG tablet Take 40 mg by mouth every evening.   Yes [provider]  ?telmisartan-hydrochlorothiazide (MICARDIS HCT) 40-12.5 MG per tablet Take 0.5 tablets by mouth daily.    Yes [provider]  ?vitamin B-12 (CYANOCOBALAMIN) 1000 MCG tablet Take 1,000 mcg by mouth daily.   Yes [provider]  ?acidophilus (RISAQUAD) CAPS capsule Take 1 capsule by mouth daily.  ?Patient not taking: Reported on 12/17/2020    [provider]  ? ? ?Allergies as of 11/12/2021 - Review Complete 07/02/2021  ?Allergen Reaction Noted  ? Levaquin [levofloxacin] Nausea And Vomiting 06/12/2015  ? ? ?Family History  ?Problem Relation Age of Onset  ? Breast cancer Sister 5  ? ? ?Social History  ? ?Socioeconomic History  ? Marital status: Married  ?  Spouse name: Not on file  ? Number of children: Not on file  ? Years of education: Not on file  ? Highest education level: Not on file  ?Occupational History  ? Not on file  ?Tobacco Use  ? Smoking status: Former  ?  Types:  Cigarettes  ?  Quit date: 1999  ?  Years since quitting: 24.2  ? Smokeless tobacco: Never  ?Vaping Use  ? Vaping Use: Never used  ?Substance and Sexual Activity  ? Alcohol use: No  ? Drug use: No  ? Sexual activity: Not on file  ?Other Topics Concern  ? Not on file  ?Social History Narrative  ? Not on file  ? ?Social Determinants of Health  ? ?Financial Resource Strain: Not on file  ?Food Insecurity: Not on file  ?Transportation Needs: Not on file  ?Physical Activity: Not on file  ?Stress: Not on file  ?Social Connections: Not on file  ?Intimate Partner Violence: Not on file  ? ? ?Review of Systems: ?See HPI, otherwise negative ROS ? ?Physical Exam: ?BP (!) 151/78   Pulse 82   Temp (!)  97.2 ?F (36.2 ?C) (Temporal)   Resp (!) 23   Ht 5' (1.524 m)   Wt 102.1 kg   SpO2 95%   BMI 43.94 kg/m?  ?General:   Alert, cooperative in NAD ?Head:  Normocephalic and atraumatic. ?Respiratory:  Normal work of breathing. ?Cardiovascular:  RRR ? ?Impression/Plan: ?Amanda Watson is here for cataract surgery. ? ?Risks, benefits, limitations, and alternatives regarding cataract surgery have been reviewed with the patient.  Questions have been answered.  All parties agreeable. ? ? ?Benay Pillow, MD  12/15/2021, 7:18 AM ? ? ?

## 2021-12-16 ENCOUNTER — Encounter: Payer: Self-pay | Admitting: Ophthalmology

## 2022-03-25 ENCOUNTER — Other Ambulatory Visit: Payer: Self-pay | Admitting: Family Medicine

## 2022-03-25 DIAGNOSIS — M5416 Radiculopathy, lumbar region: Secondary | ICD-10-CM

## 2022-04-02 ENCOUNTER — Ambulatory Visit
Admission: RE | Admit: 2022-04-02 | Discharge: 2022-04-02 | Disposition: A | Discharge status: Home / Self Care | Payer: Medicare PPO | Source: Ambulatory Visit | Attending: Family Medicine | Admitting: Family Medicine

## 2022-04-02 DIAGNOSIS — M5416 Radiculopathy, lumbar region: Secondary | ICD-10-CM

## 2022-04-20 ENCOUNTER — Other Ambulatory Visit: Payer: Self-pay | Admitting: Family Medicine

## 2022-04-20 DIAGNOSIS — N281 Cyst of kidney, acquired: Secondary | ICD-10-CM

## 2022-04-24 ENCOUNTER — Ambulatory Visit
Admission: RE | Admit: 2022-04-24 | Discharge: 2022-04-24 | Disposition: A | Discharge status: Home / Self Care | Payer: Medicare PPO | Source: Ambulatory Visit | Attending: Family Medicine | Admitting: Family Medicine

## 2022-04-24 DIAGNOSIS — N281 Cyst of kidney, acquired: Secondary | ICD-10-CM | POA: Diagnosis present

## 2022-10-27 ENCOUNTER — Other Ambulatory Visit: Payer: Self-pay

## 2022-10-27 DIAGNOSIS — Z1231 Encounter for screening mammogram for malignant neoplasm of breast: Secondary | ICD-10-CM

## 2022-11-30 ENCOUNTER — Ambulatory Visit
Admission: RE | Admit: 2022-11-30 | Discharge: 2022-11-30 | Disposition: A | Discharge status: Home / Self Care | Payer: Medicare PPO | Source: Ambulatory Visit | Attending: Family Medicine | Admitting: Family Medicine

## 2022-11-30 DIAGNOSIS — Z1231 Encounter for screening mammogram for malignant neoplasm of breast: Secondary | ICD-10-CM | POA: Diagnosis present

## 2022-12-04 ENCOUNTER — Other Ambulatory Visit: Payer: Self-pay | Admitting: Family Medicine

## 2022-12-04 DIAGNOSIS — R921 Mammographic calcification found on diagnostic imaging of breast: Secondary | ICD-10-CM

## 2022-12-04 DIAGNOSIS — R928 Other abnormal and inconclusive findings on diagnostic imaging of breast: Secondary | ICD-10-CM

## 2022-12-09 ENCOUNTER — Ambulatory Visit
Admission: RE | Admit: 2022-12-09 | Discharge: 2022-12-09 | Disposition: A | Discharge status: Home / Self Care | Payer: Medicare PPO | Source: Ambulatory Visit | Attending: Family Medicine | Admitting: Family Medicine

## 2022-12-09 DIAGNOSIS — R928 Other abnormal and inconclusive findings on diagnostic imaging of breast: Secondary | ICD-10-CM | POA: Diagnosis present

## 2022-12-09 DIAGNOSIS — R921 Mammographic calcification found on diagnostic imaging of breast: Secondary | ICD-10-CM | POA: Diagnosis present

## 2023-05-16 DIAGNOSIS — M1611 Unilateral primary osteoarthritis, right hip: Principal | ICD-10-CM | POA: Insufficient documentation

## 2023-05-16 DIAGNOSIS — Z96642 Presence of left artificial hip joint: Secondary | ICD-10-CM | POA: Insufficient documentation

## 2023-05-31 IMAGING — MG MM DIGITAL SCREENING BILAT W/ TOMO AND CAD
8 series · 8 of 24 positions shown · non-contrast
Comparison: Previous exam(s).

ACR Breast Density Category a: The breast tissue is almost entirely
fatty.

CLINICAL DATA: Screening.

EXAM:
DIGITAL SCREENING BILATERAL MAMMOGRAM WITH TOMOSYNTHESIS AND CAD
TECHNIQUE: Bilateral screening digital craniocaudal and mediolateral oblique
mammograms were obtained. Bilateral screening digital breast
tomosynthesis was performed. The images were evaluated with
computer-aided detection.

[L MLO synth-2D]
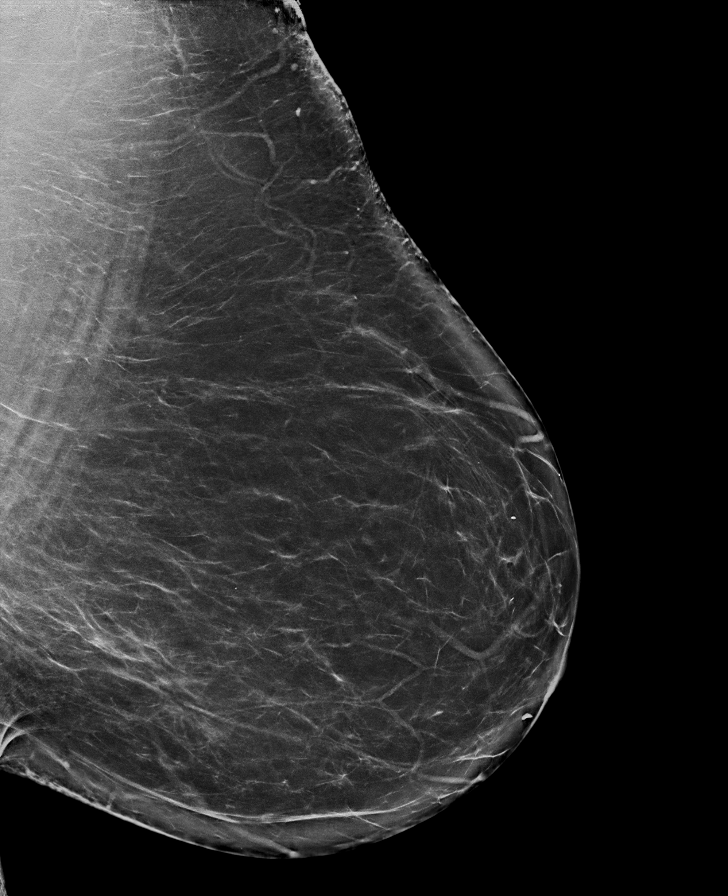

[L CC synth-2D]
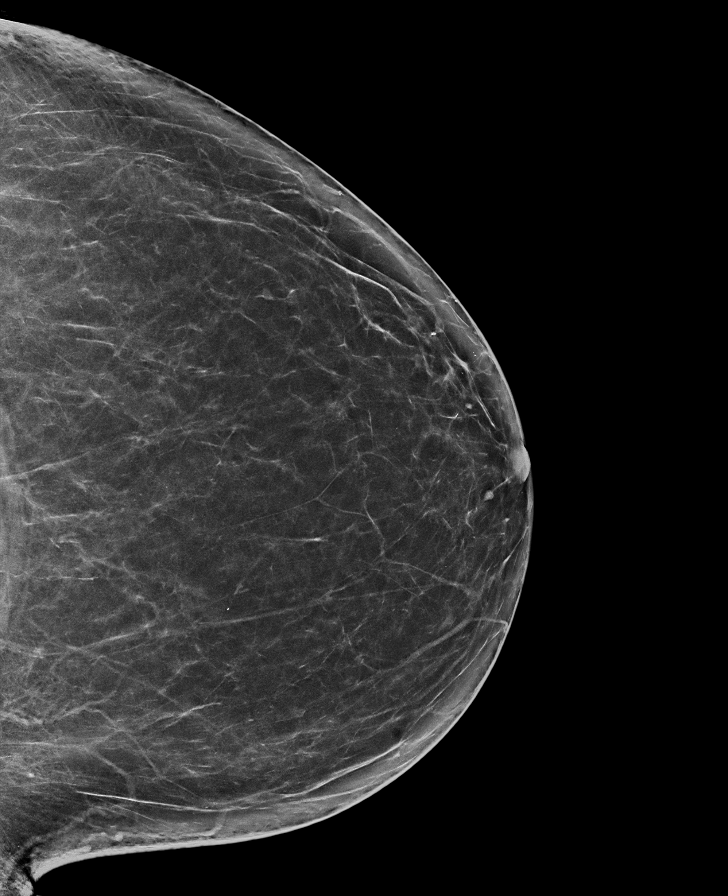

[R CC synth-2D]
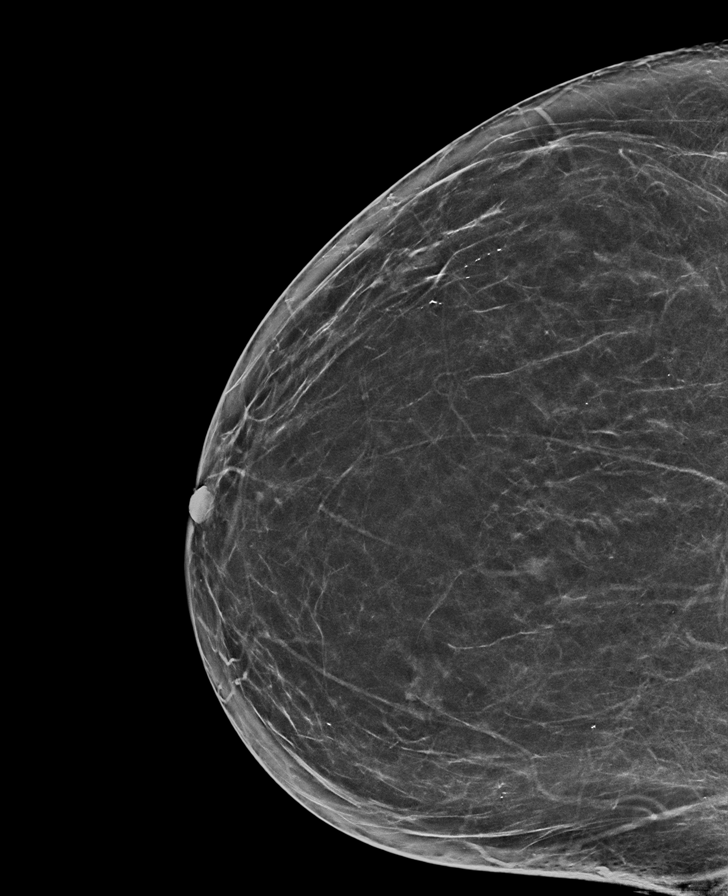

[R MLO synth-2D]
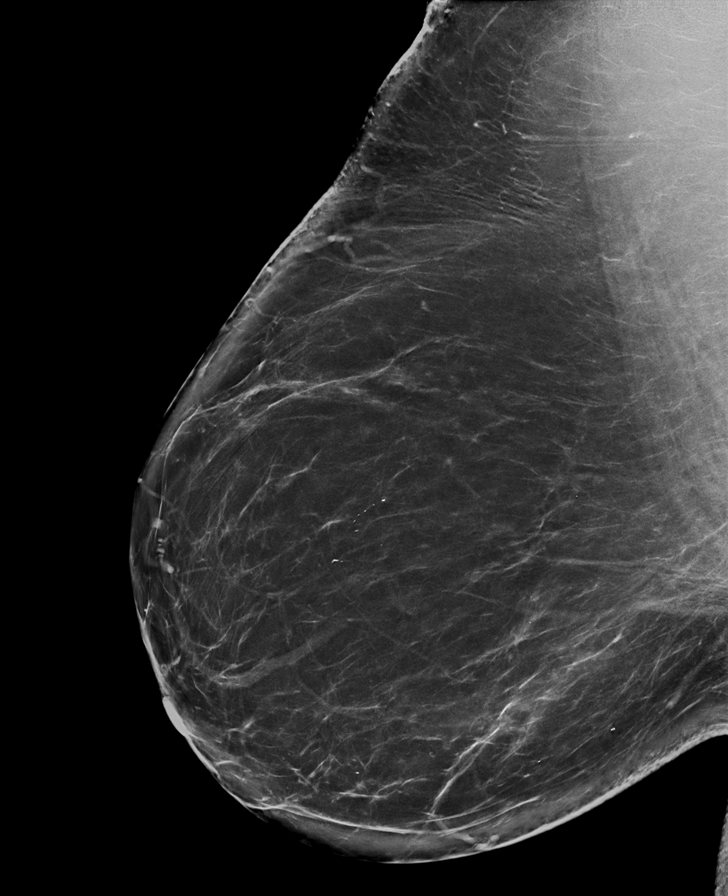

[L MLO tomo · tomo slice 49/97.0]
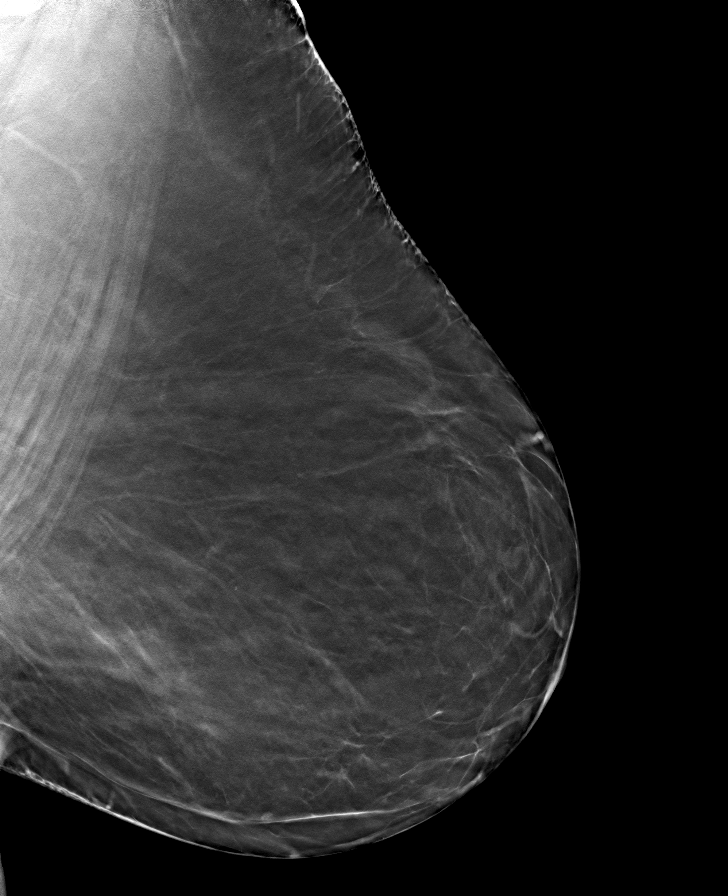

[R CC tomo · tomo slice 39/76.0]
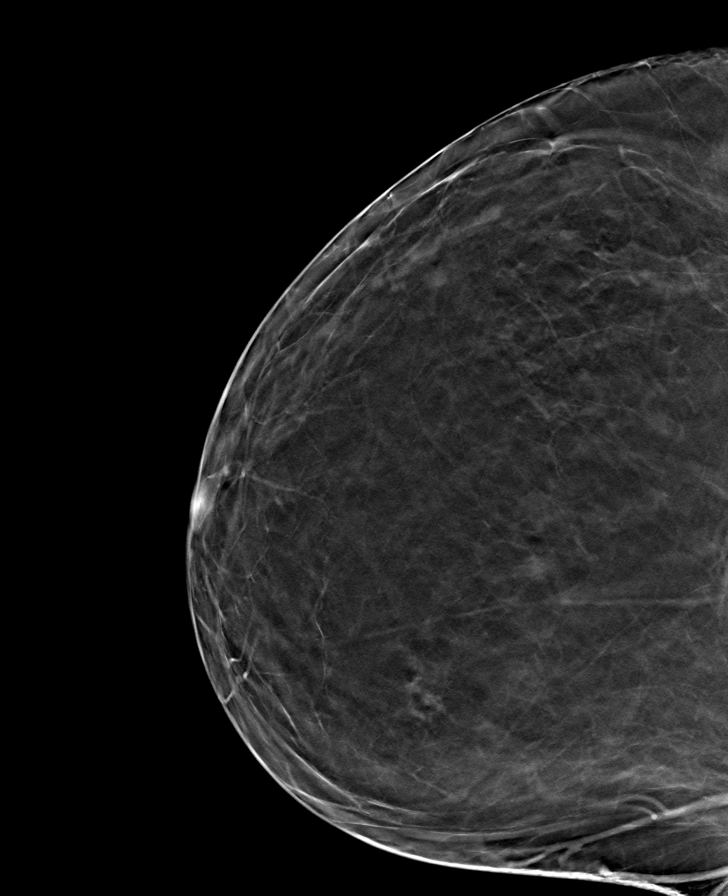

[L CC tomo · tomo slice 41/81.0]
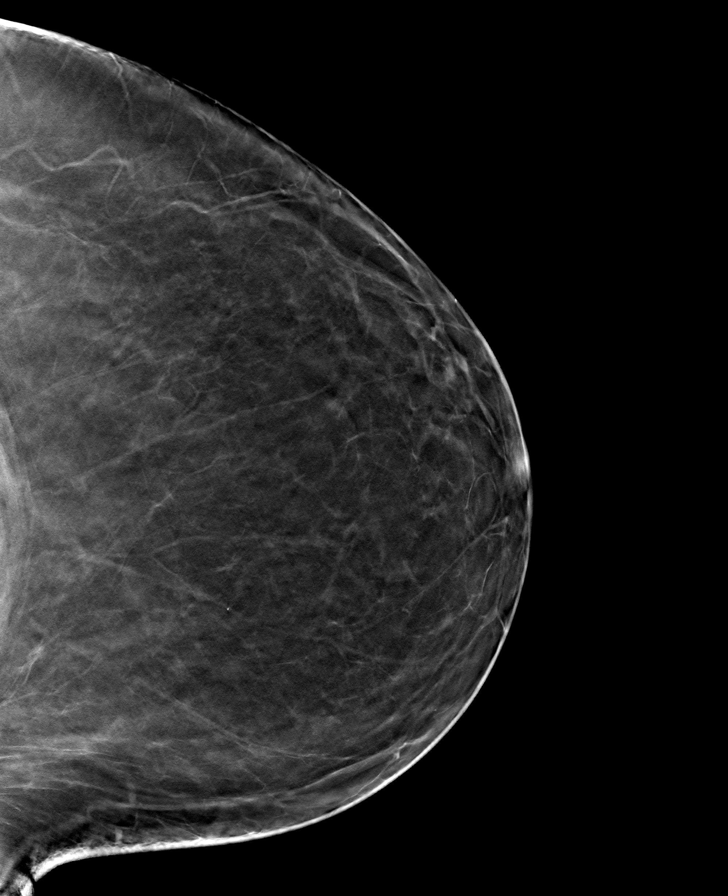

[R MLO tomo · tomo slice 51/101.0]
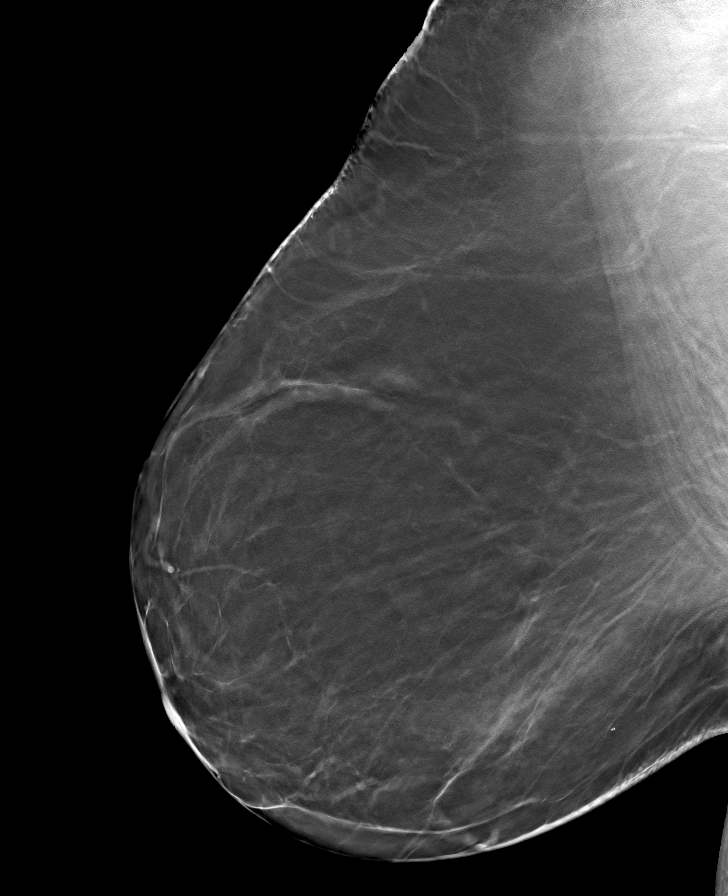

[8 of 24 positions shown; findings below may reference images not displayed]

FINDINGS: There are no findings suspicious for malignancy.
IMPRESSION: No mammographic evidence of malignancy. A result letter of this
screening mammogram will be mailed directly to the patient.

RECOMMENDATION:
Screening mammogram in one year. (Code:0E-3-N98)

BI-RADS CATEGORY  1: Negative.

## 2023-06-30 NOTE — Discharge Instructions (Signed)
Instructions after Total Hip Replacement   Amanda Watson P. Amanda Watson., M.D.    Dept. of Orthopaedics & Sports Medicine [facility] [address] [place], [place]  [address]  Phone: [phone]   Fax: [phone]        [email or web address]        DIET: Drink plenty of non-alcoholic fluids. Resume your normal diet. Include foods high in fiber.  ACTIVITY:  You may use crutches or a walker with weight-bearing as tolerated, unless instructed otherwise. You may be weaned off of the walker or crutches by your Physical Therapist.  Do NOT reach below the level of your knees or cross your legs until allowed.    Continue doing gentle exercises. Exercising will reduce the pain and swelling, increase motion, and prevent muscle weakness.   Please continue to use the TED compression stockings for 6 weeks. You may remove the stockings at night, but should reapply them in the morning. Do not drive or operate any equipment until instructed.  WOUND CARE:  Continue to use ice packs periodically to reduce pain and swelling. Keep the incision clean and dry. You may bathe or shower after the staples are removed at the first office visit following surgery.  MEDICATIONS: You may resume your regular medications. Please take the pain medication as prescribed on the medication. Do not take pain medication on an empty stomach. You have been given a prescription for a blood thinner to prevent blood clots. Please take the medication as instructed. (NOTE: After completing a 2 week course of Lovenox, take one Enteric-coated aspirin once a day.) Pain medications and iron supplements can cause constipation. Use a stool softener (Senokot or Colace) on a daily basis and a laxative (dulcolax or miralax) as needed. Do not drive or drink alcoholic beverages when taking pain medications.  CALL THE OFFICE FOR: Temperature above 101 degrees Excessive bleeding or drainage on the dressing. Excessive swelling,  coldness, or paleness of the toes. Persistent nausea and vomiting.  FOLLOW-UP:  You should have an appointment to return to the office in 6 weeks after surgery. Arrangements have been made for continuation of Physical Therapy (either home therapy or outpatient therapy).     [facility] Department Directory         [email or web address]       [email or web address]          Cardiology  Appointments: [place] - [phone]  Endocrinology  Appointments: [place] [phone] [place] - [phone]  Gastroenterology  Appointments: [place] [phone] [place] - [phone]        General Surgery   Appointments: [facility]  Internal Medicine/Family Medicine  Appointments: [facility] [place] - [phone] [place] - [phone]  Metabolic and Weigh Loss Surgery  Appointments: [facility]        Neurology  Appointments: [place] [phone] [place] - [phone]  Neurosurgery  Appointments: [place]  Obstetrics & Gynecology  Appointments: [place] [phone] [place] - [phone]        Pediatrics  Appointments: [place] [phone] [place] - [phone]  Physiatry  Appointments: [place] [phone]  Physical Therapy  Appointments: [place] - [phone]        Podiatry  Appointments: [place] [phone] [place] - [phone]  Pulmonology  Appointments: [place]  Rheumatology  Appointments: [place] [phone]        [place] Location: [facility]  [address] [place], [place]  [address]  [place] Location: [facility] [address] [place], [place]  [address]  [place] Location: [facility] [address] [place], [place]  [address]

## 2023-07-05 ENCOUNTER — Other Ambulatory Visit: Payer: Self-pay

## 2023-07-05 ENCOUNTER — Encounter
Admission: RE | Admit: 2023-07-05 | Discharge: 2023-07-05 | Disposition: A | Discharge status: Home / Self Care | Payer: Medicare PPO | Source: Ambulatory Visit | Attending: Orthopedic Surgery | Admitting: Orthopedic Surgery

## 2023-07-05 VITALS — BP 132/71 | HR 68 | Resp 16 | Wt 223.0 lb

## 2023-07-05 DIAGNOSIS — Z01812 Encounter for preprocedural laboratory examination: Secondary | ICD-10-CM

## 2023-07-05 DIAGNOSIS — M1611 Unilateral primary osteoarthritis, right hip: Secondary | ICD-10-CM

## 2023-07-05 DIAGNOSIS — E119 Type 2 diabetes mellitus without complications: Secondary | ICD-10-CM | POA: Diagnosis not present

## 2023-07-05 DIAGNOSIS — I4510 Unspecified right bundle-branch block: Secondary | ICD-10-CM | POA: Insufficient documentation

## 2023-07-05 DIAGNOSIS — Z01810 Encounter for preprocedural cardiovascular examination: Secondary | ICD-10-CM | POA: Diagnosis not present

## 2023-07-05 DIAGNOSIS — Z01818 Encounter for other preprocedural examination: Secondary | ICD-10-CM | POA: Insufficient documentation

## 2023-07-05 HISTORY — DX: Unspecified osteoarthritis, unspecified site: M19.90

## 2023-07-05 LAB — URINALYSIS, ROUTINE W REFLEX MICROSCOPIC
Bacteria, UA: NONE SEEN
Bilirubin Urine: NEGATIVE
Glucose, UA: NEGATIVE mg/dL
Hgb urine dipstick: NEGATIVE
Ketones, ur: NEGATIVE mg/dL
Nitrite: NEGATIVE
Protein, ur: NEGATIVE mg/dL
Specific Gravity, Urine: 1.003 — ABNORMAL LOW (ref 1.005–1.030)
pH: 6 (ref 5.0–8.0)

## 2023-07-05 LAB — COMPREHENSIVE METABOLIC PANEL
ALT: 29 U/L (ref 0–44)
AST: 20 U/L (ref 15–41)
Albumin: 3.7 g/dL (ref 3.5–5.0)
Alkaline Phosphatase: 80 U/L (ref 38–126)
Anion gap: 10 (ref 5–15)
BUN: 15 mg/dL (ref 8–23)
CO2: 27 mmol/L (ref 22–32)
Calcium: 9.8 mg/dL (ref 8.9–10.3)
Chloride: 101 mmol/L (ref 98–111)
Creatinine, Ser: 1.27 mg/dL — ABNORMAL HIGH (ref 0.44–1.00)
GFR, Estimated: 44 mL/min — ABNORMAL LOW (ref >60)
Glucose, Bld: 92 mg/dL (ref 70–99)
Potassium: 3.7 mmol/L (ref 3.5–5.1)
Sodium: 138 mmol/L (ref 135–145)
Total Bilirubin: 0.4 mg/dL (ref 0.3–1.2)
Total Protein: 7.2 g/dL (ref 6.5–8.1)

## 2023-07-05 LAB — CBC
HCT: 39.3 % (ref 36.0–46.0)
Hemoglobin: 11.9 g/dL — ABNORMAL LOW (ref 12.0–15.0)
MCH: 25.1 pg — ABNORMAL LOW (ref 26.0–34.0)
MCHC: 30.3 g/dL (ref 30.0–36.0)
MCV: 82.9 fL (ref 80.0–100.0)
Platelets: 289 10*3/uL (ref 150–400)
RBC: 4.74 MIL/uL (ref 3.87–5.11)
RDW: 15.8 % — ABNORMAL HIGH (ref 11.5–15.5)
WBC: 7.8 10*3/uL (ref 4.0–10.5)
nRBC: 0 % (ref 0.0–0.2)

## 2023-07-05 LAB — SURGICAL PCR SCREEN
MRSA, PCR: NEGATIVE
Staphylococcus aureus: NEGATIVE

## 2023-07-05 LAB — TYPE AND SCREEN
ABO/RH(D): B POS
Antibody Screen: NEGATIVE

## 2023-07-05 LAB — C-REACTIVE PROTEIN: CRP: 0.5 mg/dL (ref <1.0)

## 2023-07-05 LAB — HEMOGLOBIN A1C
Hgb A1c MFr Bld: 6.3 % — ABNORMAL HIGH (ref 4.8–5.6)
Mean Plasma Glucose: 134.11 mg/dL

## 2023-07-05 LAB — SEDIMENTATION RATE: Sed Rate: 30 mm/h (ref 0–30)

## 2023-07-05 NOTE — Patient Instructions (Addendum)
Your procedure is scheduled on: 07/14/23 - Wednesday Report to the Registration Desk on the 1st floor of the [facility]. To find out your arrival time, please call [phone] between 1PM - 3PM on: 07/13/23 - Tuesday If your arrival time is 6:00 am, do not arrive before that time as the [facility] entrance doors do not open until 6:00 am.  REMEMBER: Instructions that are not followed completely may result in serious medical risk, up to and including death; or upon the discretion of your surgeon and anesthesiologist your surgery may need to be rescheduled.  Do not eat food after midnight the night before surgery.  No gum chewing or hard candies.  You may however, drink water up to 2 hours before you are scheduled to arrive for your surgery. Do not drink anything within 2 hours of your scheduled arrival time.   In addition, your doctor has ordered for you to drink the provided:  Gatorade G2 Drinking this carbohydrate drink up to two hours before surgery helps to reduce insulin resistance and improve patient outcomes. Please complete drinking 2 hours before scheduled arrival time.  One week prior to surgery: Stop beginning 07/07/23, Anti-inflammatories (NSAIDS) such as Advil, Aleve, Ibuprofen, Motrin, Naproxen, Naprosyn and Aspirin based products such as Excedrin, Goody's Powder, BC Powder. You may however, continue to take Tylenol if needed for pain up until the day of surgery.  Stop taking beginning 07/07/23, ANY OVER THE COUNTER supplements until after surgery.   Continue taking all prescribed medications with the exception of the following:  metFORMIN (GLUCOPHAGE) starting on 07/12/23. telmisartan-hydrochlorothiazide (MICARDIS HCT) hold on the day of surgery.   TAKE ONLY THESE MEDICATIONS THE MORNING OF SURGERY WITH A SIP OF WATER:  omeprazole (PRILOSEC) - (take one the night before and one on the morning of surgery - helps to prevent nausea after  surgery.) Fluticasone-Salmeterol (ADVAIR)   Use inhaler albuterol (PROVENTIL HFA;VENTOLIN HFA)  on the day of surgery and bring to the hospital.   No Alcohol for 24 hours before or after surgery.  No Smoking including e-cigarettes for 24 hours before surgery.  No chewable tobacco products for at least 6 hours before surgery.  No nicotine patches on the day of surgery.  Do not use any "recreational" drugs for at least a week (preferably 2 weeks) before your surgery.  Please be advised that the combination of cocaine and anesthesia may have negative outcomes, up to and including death. If you test positive for cocaine, your surgery will be cancelled.  On the morning of surgery brush your teeth with toothpaste and water, you may rinse your mouth with mouthwash if you wish. Do not swallow any toothpaste or mouthwash.  Use CHG Soap or wipes as directed on instruction sheet.  Do not wear jewelry, make-up, hairpins, clips or nail polish.  For welded (permanent) jewelry: bracelets, anklets, waist bands, etc.  Please have this removed prior to surgery.  If it is not removed, there is a chance that hospital personnel will need to cut it off on the day of surgery.  Do not wear lotions, powders, or perfumes.   Do not shave body hair from the neck down 48 hours before surgery.  Contact lenses, hearing aids and dentures may not be worn into surgery.  Do not bring valuables to the hospital. [facility] is not responsible for any missing/lost belongings or valuables.   Bring your C-PAP to the hospital in case you may have to spend the night.  Notify your doctor if there is any change in your medical condition (cold, fever, infection).  Wear comfortable clothing (specific to your surgery type) to the hospital.  After surgery, you can help prevent lung complications by doing breathing exercises.  Take deep breaths and cough every 1-2 hours. Your doctor may order a device called an Incentive  Spirometer to help you take deep breaths. When coughing or sneezing, hold a pillow firmly against your incision with both hands. This is called "splinting." Doing this helps protect your incision. It also decreases belly discomfort.  If you are being admitted to the hospital overnight, leave your suitcase in the car. After surgery it may be brought to your room.  In case of increased patient census, it may be necessary for you, the patient, to continue your postoperative care in the Same Day Surgery department.  If you are being discharged the day of surgery, you will not be allowed to drive home. You will need a responsible individual to drive you home and stay with you for 24 hours after surgery.   If you are taking public transportation, you will need to have a responsible individual with you.  Please call the Pre-admissions Testing Dept. at [phone] if you have any questions about these instructions.  Surgery Visitation Policy:  Patients having surgery or a procedure may have two visitors.  Children under the age of 2 must have an adult with them who is not the patient.  Inpatient Visitation:    Visiting hours are 7 a.m. to 8 p.m. Up to four visitors are allowed at one time in a patient room. The visitors may rotate out with other people during the day.  One visitor age 39 or older may stay with the patient overnight and must be in the room by 8 p.m.    Pre-operative 5 CHG Bath Instructions   You can play a key role in reducing the risk of infection after surgery. Your skin needs to be as free of germs as possible. You can reduce the number of germs on your skin by washing with CHG (chlorhexidine gluconate) soap before surgery. CHG is an antiseptic soap that kills germs and continues to kill germs even after washing.   DO NOT use if you have an allergy to chlorhexidine/CHG or antibacterial soaps. If your skin becomes reddened or irritated, stop using the CHG and notify one  of our RNs at [phone].   Please shower with the CHG soap starting 4 days before surgery using the following schedule: 10/05 - 10/09.    Please keep in mind the following:  DO NOT shave, including legs and underarms, starting the day of your first shower.   You may shave your face at any point before/day of surgery.  Place clean sheets on your bed the day you start using CHG soap. Use a clean washcloth (not used since being washed) for each shower. DO NOT sleep with pets once you start using the CHG.   CHG Shower Instructions:  If you choose to wash your hair and private area, wash first with your normal shampoo/soap.  After you use shampoo/soap, rinse your hair and body thoroughly to remove shampoo/soap residue.  Turn the water OFF and apply about 3 tablespoons (45 ml) of CHG soap to a CLEAN washcloth.  Apply CHG soap ONLY FROM YOUR NECK DOWN TO YOUR TOES (washing for 3-5 minutes)  DO NOT use CHG soap on face, private areas, open wounds, or sores.  Pay special  attention to the area where your surgery is being performed.  If you are having back surgery, having someone wash your back for you may be helpful. Wait 2 minutes after CHG soap is applied, then you may rinse off the CHG soap.  Pat dry with a clean towel  Put on clean clothes/pajamas   If you choose to wear lotion, please use ONLY the CHG-compatible lotions on the back of this paper.     Additional instructions for the day of surgery: DO NOT APPLY any lotions, deodorants, cologne, or perfumes.   Put on clean/comfortable clothes.  Brush your teeth.  Ask your nurse before applying any prescription medications to the skin.      CHG Compatible Lotions   Aveeno Moisturizing lotion  Cetaphil Moisturizing Cream  Cetaphil Moisturizing Lotion  Clairol Herbal Essence Moisturizing Lotion, Dry Skin  Clairol Herbal Essence Moisturizing Lotion, Extra Dry Skin  Clairol Herbal Essence Moisturizing Lotion, Normal Skin  Curel Age  Defying Therapeutic Moisturizing Lotion with Alpha Hydroxy  Curel Extreme Care Body Lotion  Curel Soothing Hands Moisturizing Hand Lotion  Curel Therapeutic Moisturizing Cream, Fragrance-Free  Curel Therapeutic Moisturizing Lotion, Fragrance-Free  Curel Therapeutic Moisturizing Lotion, Original Formula  Eucerin Daily Replenishing Lotion  Eucerin Dry Skin Therapy Plus Alpha Hydroxy Crme  Eucerin Dry Skin Therapy Plus Alpha Hydroxy Lotion  Eucerin Original Crme  Eucerin Original Lotion  Eucerin Plus Crme Eucerin Plus Lotion  Eucerin TriLipid Replenishing Lotion  Keri Anti-Bacterial Hand Lotion  Keri Deep Conditioning Original Lotion Dry Skin Formula Softly Scented  Keri Deep Conditioning Original Lotion, Fragrance Free Sensitive Skin Formula  Keri Lotion Fast Absorbing Fragrance Free Sensitive Skin Formula  Keri Lotion Fast Absorbing Softly Scented Dry Skin Formula  Keri Original Lotion  Keri Skin Renewal Lotion Keri Silky Smooth Lotion  Keri Silky Smooth Sensitive Skin Lotion  Nivea Body Creamy Conditioning Oil  Nivea Body Extra Enriched Lotion  Nivea Body Original Lotion  Nivea Body Sheer Moisturizing Lotion Nivea Crme  Nivea Skin Firming Lotion  NutraDerm 30 Skin Lotion  NutraDerm Skin Lotion  NutraDerm Therapeutic Skin Cream  NutraDerm Therapeutic Skin Lotion  ProShield Protective Hand Cream  Provon moisturizing lotion  How to Use an Incentive Spirometer  An incentive spirometer is a tool that measures how well you are filling your lungs with each breath. Learning to take long, deep breaths using this tool can help you keep your lungs clear and active. This may help to reverse or lessen your chance of developing breathing (pulmonary) problems, especially infection. You may be asked to use a spirometer: After a surgery. If you have a lung problem or a history of smoking. After a long period of time when you have been unable to move or be active. If the spirometer includes  an indicator to show the highest number that you have reached, your health care provider or respiratory therapist will help you set a goal. Keep a log of your progress as told by your health care provider. What are the risks? Breathing too quickly may cause dizziness or cause you to pass out. Take your time so you do not get dizzy or light-headed. If you are in pain, you may need to take pain medicine before doing incentive spirometry. It is harder to take a deep breath if you are having pain. How to use your incentive spirometer  Sit up on the edge of your bed or on a chair. Hold the incentive spirometer so that it is in an upright  position. Before you use the spirometer, breathe out normally. Place the mouthpiece in your mouth. Make sure your lips are closed tightly around it. Breathe in slowly and as deeply as you can through your mouth, causing the piston or the ball to rise toward the top of the chamber. Hold your breath for 3-5 seconds, or for as long as possible. If the spirometer includes a coach indicator, use this to guide you in breathing. Slow down your breathing if the indicator goes above the marked areas. Remove the mouthpiece from your mouth and breathe out normally. The piston or ball will return to the bottom of the chamber. Rest for a few seconds, then repeat the steps 10 or more times. Take your time and take a few normal breaths between deep breaths so that you do not get dizzy or light-headed. Do this every 1-2 hours when you are awake. If the spirometer includes a goal marker to show the highest number you have reached (best effort), use this as a goal to work toward during each repetition. After each set of 10 deep breaths, cough a few times. This will help to make sure that your lungs are clear. If you have an incision on your chest or abdomen from surgery, place a pillow or a rolled-up towel firmly against the incision when you cough. This can help to reduce pain while  taking deep breaths and coughing. General tips When you are able to get out of bed: Walk around often. Continue to take deep breaths and cough in order to clear your lungs. Keep using the incentive spirometer until your health care provider says it is okay to stop using it. If you have been in the hospital, you may be told to keep using the spirometer at home. Contact a health care provider if: You are having difficulty using the spirometer. You have trouble using the spirometer as often as instructed. Your pain medicine is not giving enough relief for you to use the spirometer as told. You have a fever. Get help right away if: You develop shortness of breath. You develop a cough with bloody mucus from the lungs. You have fluid or blood coming from an incision site after you cough. Summary An incentive spirometer is a tool that can help you learn to take long, deep breaths to keep your lungs clear and active. You may be asked to use a spirometer after a surgery, if you have a lung problem or a history of smoking, or if you have been inactive for a long period of time. Use your incentive spirometer as instructed every 1-2 hours while you are awake. If you have an incision on your chest or abdomen, place a pillow or a rolled-up towel firmly against your incision when you cough. This will help to reduce pain. Get help right away if you have shortness of breath, you cough up bloody mucus, or blood comes from your incision when you cough. This information is not intended to replace advice given to you by your health care provider. Make sure you discuss any questions you have with your health care provider. Document Revised: 12/11/2019 Document Reviewed: 12/11/2019 Elsevier Patient Education  2023 [employer].

## 2023-07-07 NOTE — TOC Initial Note (Signed)
Transition of Care [facility]) - Initial/Assessment Note    Patient Details  Name: Amanda Watson MRN: [id number] Date of Birth: 1950-10-20  Transition of [facility]) CM/SW Contact:    Marlowe Sax, RN Phone Number: 07/07/2023, 3:46 PM  Clinical Narrative:                 Received a call from [facility] alerting me that [facility] does not service the area where the patient lives.  I reached out to [place] and [employer] and [employer] as well as [facility] to see if they service the area where a patient lives    [place] does not service [place]  [place] does not service [place] [employer] [facility] [employer] does [place]         Patient Goals and CMS Choice            Expected Discharge Plan and Services                                              Prior Living Arrangements/Services                       Activities of Daily Living      Permission Sought/Granted                  Emotional Assessment              Admission diagnosis:  PRIMARY OSTEOARTHRITIS OF RIGHT HIP. Patient Active Problem List   Diagnosis Date Noted   Atypical endometrial hyperplasia 08/15/2020   Post-operative state 08/15/2020   PCP:  Dorothey Baseman, MD Pharmacy:   [facility] [address] [place] - [address] [place] [address] Phone: [phone] Fax: [phone]     Social Determinants of Health (SDOH) Social History: SDOH Screenings   Food Insecurity: Patient Declined (10/23/2022)   Received [facility], [facility]  Transportation Needs: Patient Declined (10/23/2022)   Received from [facility], [facility]  Utilities: Patient Declined (10/23/2022)   Received from [facility], [facility]  Financial Resource Strain: Patient Declined (10/23/2022)   Received from [facility], [facility]  Tobacco Use: Medium Risk (07/06/2023)   Received from [facility]   SDOH Interventions:     Readmission Risk Interventions     No data to display

## 2023-07-13 MED ORDER — DEXAMETHASONE SODIUM PHOSPHATE 10 MG/ML IJ SOLN
8.0000 mg | Freq: Once | INTRAMUSCULAR | Status: AC
  Administered 2023-07-14: 8 mg via INTRAVENOUS

## 2023-07-13 MED ORDER — TRANEXAMIC ACID-NACL 1000-0.7 MG/100ML-% IV SOLN
1000.0000 mg | INTRAVENOUS | Status: AC
  Administered 2023-07-14: 1000 mg via INTRAVENOUS

## 2023-07-13 MED ORDER — GABAPENTIN 300 MG PO CAPS
300.0000 mg | ORAL_CAPSULE | Freq: Once | ORAL | Status: AC
  Administered 2023-07-14: 300 mg via ORAL

## 2023-07-13 MED ORDER — SODIUM CHLORIDE 0.9 % IV SOLN: INTRAVENOUS | Status: DC

## 2023-07-13 MED ORDER — CHLORHEXIDINE GLUCONATE 4 % EX SOLN: 60.0000 mL | Freq: Once | CUTANEOUS | Status: DC

## 2023-07-13 MED ORDER — CELECOXIB 200 MG PO CAPS
400.0000 mg | ORAL_CAPSULE | Freq: Once | ORAL | Status: AC
  Administered 2023-07-14: 400 mg via ORAL

## 2023-07-13 MED ORDER — ORAL CARE MOUTH RINSE: 15.0000 mL | Freq: Once | OROMUCOSAL | Status: AC

## 2023-07-13 MED ORDER — CHLORHEXIDINE GLUCONATE 0.12 % MT SOLN
15.0000 mL | Freq: Once | OROMUCOSAL | Status: AC
  Administered 2023-07-14: 15 mL via OROMUCOSAL

## 2023-07-13 MED ORDER — CEFAZOLIN SODIUM-DEXTROSE 2-4 GM/100ML-% IV SOLN
2.0000 g | INTRAVENOUS | Status: AC
  Administered 2023-07-14: 2 g via INTRAVENOUS

## 2023-07-13 NOTE — H&P (Signed)
ORTHOPAEDIC HISTORY & PHYSICAL Latanya Maudlin, PA - 07/06/2023 3:15 PM EDT Formatting of this note is different from the original. Images from the original note were not included. Chief Complaint Chief Complaint Patient presents with Hip Pain H & P RIGHT HIP  Reason for Visit Amanda Watson is a 72 y.o. who presents today for a history and physical. She is to undergo a right total hip arthroplasty on 07/14/2023. Since her last visit here to clinic there have been no change in her condition. The patient impresses her desire to proceed with surgery.  She reports a long history of right buttocks and groin pain. She has been treated by Dr. 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 The pain is worse with weight bearing. The hip pain limits the patient's ability to ambulate long distances. She reports progressive decrease of her right hip range of motion and reports difficulty with donning/doffing shoes and getting into a car. The patient has not appreciated any significant improvement despite Tylenol, tramadol, ambulatory aids and activity modification.. She is using a cane for ambulation. The patient states that the hip pain has progressed to the point that it is significantly interfering with her activities of daily living.  Patient is status post left total hip arthroplasty performed approximately 10 years ago. She is having no issues with the left side.  Past Medical History Past Medical History: Diagnosis Date Anemia Asthma without status asthmaticus (HHS-HCC) Chronic obstructive asthma, unspecified (CMS/HHS-HCC) Diabetes mellitus type 2, uncomplicated (CMS/HHS-HCC) Esophageal reflux Essential hypertension, benign GERD (gastroesophageal reflux disease) History of Bell's palsy February 2011 History of chickenpox Hypercholesterolemia Hypersomnia with sleep apnea, unspecified Multinodular goiter Biopsy of rt 2.1 cm and left 2.6 cm nodules were benign, 06/2014. [place]  ultrasounds from 2016-2018 were stable. No routine follow-up ultrasounds needed. Other and unspecified hyperlipidemia Psoriasis Pulmonary nodules Sleep apnea on CPAP Spinal stenosis MRI 01/11/12  Past Surgical History Past Surgical History: Procedure Laterality Date Carpal tunnel repair Left 09/2004 Carpal tunnel repair Right 2006 Left total hip arthroplasty 05/03/2013 Dr Ernest Pine HERNIA REPAIR 06/14/2015 Umbilical Fractional D&C with hysteroscopy 06/14/2020 HYSTERECTOMY 08/15/2020 TAH-BSO TAH-BSO 08/15/2020 COLONOSCOPY 12/17/2020 Diverticulosis/PHx CP/FHx CC/Repeat 82yrs/CTL CESAREAN SECTION x 1 CHOLECYSTECTOMY Gallbladder, [place], 1983, for gallstones EXCISION GANGLION CYST WRIST PRIMARY Left volar TUBAL LIGATION  Past Family History Family History Problem Relation Age of Onset Heart failure Mother CHF Myocardial Infarction (Heart attack) Father MI at age 78  Medications Current Outpatient Medications Medication Sig Dispense Refill ACCU-CHEK AVIVA PLUS METER Misc 1 each by XX route as directed 1 each 0 albuterol MDI, PROVENTIL, VENTOLIN, PROAIR, HFA 90 mcg/actuation inhaler use 1 strip to check glucose twice daily 54 g 3 aspirin 81 MG EC tablet Take 81 mg by mouth once daily azelastine (ASTELIN) 137 mcg nasal spray Place 1 spray into both nostrils 2 (two) times daily 30 mL 1 blood glucose diagnostic (ACCU-CHEK AVIVA PLUS TEST STRP) test strip 2 (two) times daily 180 each 1 calcium carbonate-vitamin D3 600 mg-5 mcg (200 unit) Cap Take 1 capsule by mouth once daily. cholecalciferol (VITAMIN D3) 1000 unit tablet Take 2,000 Units by mouth once daily cyanocobalamin (VITAMIN B12) 1000 MCG tablet Take 1,000 mcg by mouth once daily diphenhydrAMINE (BENADRYL) 25 mg tablet Take 25 mg by mouth at bedtime as needed for Sleep docusate (COLACE) 100 MG capsule Take 100 mg by mouth 2 (two) times daily as needed. ferrous sulfate 325 (65 FE) MG tablet Take 325 mg by mouth daily  with breakfast Takes 3 times  a week due to constipation fluticasone propion-salmeteroL (ADVAIR DISKUS) 250-50 mcg/dose diskus inhaler Inhale 1 inhalation into the lungs every 12 (twelve) hours 3 each 3 gabapentin (NEURONTIN) 300 MG capsule Take 1 capsule by mouth 4 times daily 120 capsule 2 L. acidophilus/Bifid. animalis 32 billion cell Cap Take 1 capsule by mouth once daily magnesium oxide (MAG-OX) 400 mg tablet Take 1 tablet by mouth once daily. metFORMIN (GLUCOPHAGE) 1000 MG tablet take 1 tablet by mouth twice daily with meals 180 tablet 0 montelukast (SINGULAIR) 10 mg tablet take 1 tablet by mouth once daily in the evening 90 tablet 0 niacin (NIASPAN) 500 MG ER tablet Take 1 tablet by mouth once daily 90 tablet 0 omeprazole (PRILOSEC) 20 MG DR capsule Take 1 capsule by mouth once daily 90 capsule 0 potassium gluconate 550 mg (90 mg) Tab Take 1 tablet by mouth once daily. simvastatin (ZOCOR) 40 MG tablet Take 1 tablet by mouth in the evening 90 tablet 0 telmisartan-hydroCHLOROthiazide (MICARDIS HCT) 40-12.5 mg tablet Take 1/2 (one-half) tablet by mouth once daily 90 tablet 3 traMADoL (ULTRAM) 50 mg tablet Take 1 tablet by mouth three times daily as needed 90 tablet 0  No current facility-administered medications for this visit.  Allergies Allergies Allergen Reactions Levaquin [Levofloxacin] Nausea and Vomiting   Review of Systems A comprehensive 14 point ROS was performed, reviewed, and the pertinent orthopaedic findings are documented in the HPI.  Exam BP 122/70 (BP Location: Left upper arm, Patient Position: Sitting, BP Cuff Size: Adult)  Ht 149.9 cm (4\' 11" )  Wt 95.9 kg (211 lb 6.4 oz)  BMI 42.70 kg/m  General: Well-developed well-nourished female seen in no acute distress.  HEENT: Atraumatic,normocephalic. Pupils are equal and reactive to light. Oropharynx is clear with moist mucosa  Lungs: Clear to auscultation bilaterally  Cardiovascular: Regular rate and rhythm.  Normal S1, S2. No murmurs. No appreciable gallops or rubs. Peripheral pulses are palpable.  Abdomen: Soft, non-tender, nondistended. Bowel sounds present  Extremity: Right Hip: Pelvic tilt: Negative Limb lengths: Equal with the patient standing Soft tissue swelling:Negative Erythema: Negative Crepitance: Negative Tenderness: Greater trochanter is nontender to palpation. Moderate pain is elicited by axial compression or extremes of rotation. Atrophy: No atrophy. Fair to good hip flexor and abductor strength. Range of Motion:EXT/FLEX: 0/0/90 ADD/ABD: 20/0/20 IR/ER:  Neurological:  The patient is alert and oriented Sensation to light touch appears to be intact and within normal limits Gross motor strength appeared to be equal to 5/5  Vascular :  Peripheral pulses felt to be palpable. Capillary refill appears to be intact and within normal limits  X-ray  1. X-rays taken on 05/13/2023 of the right hip shows significant narrowing of the cartilage space with bone-on-bone being noted. Subchondral sclerosis as well as osteophyte formation was present.  Impression  1. Degenerative arthrosis right hip  Plan  1. Patient's medication was gone over on today's visit 2. Past medical history reviewed 3. Postop rehab course discussed 4. Return to clinic 6 weeks postop. Sooner if any problems  This note was generated in part with voice recognition software and I apologize for any typographical errors that were not detected and corrected   Tera Partridge PA Electronically signed by Latanya Maudlin, PA at 07/06/2023 3:20 PM EDT

## 2023-07-14 ENCOUNTER — Other Ambulatory Visit: Payer: Self-pay

## 2023-07-14 ENCOUNTER — Observation Stay
Admission: RE | Admit: 2023-07-14 | Discharge: 2023-07-15 | Disposition: A | Discharge status: Home / Self Care | Payer: Medicare PPO | Attending: Orthopedic Surgery | Admitting: Orthopedic Surgery

## 2023-07-14 ENCOUNTER — Encounter
Admission: RE | Disposition: A | Discharge status: Home / Self Care | Payer: Self-pay | Source: Home / Self Care | Attending: Orthopedic Surgery

## 2023-07-14 ENCOUNTER — Encounter: Payer: Self-pay | Admitting: Orthopedic Surgery

## 2023-07-14 ENCOUNTER — Observation Stay: Payer: Medicare PPO

## 2023-07-14 ENCOUNTER — Ambulatory Visit: Payer: Medicare PPO | Admitting: Urgent Care

## 2023-07-14 DIAGNOSIS — J45909 Unspecified asthma, uncomplicated: Secondary | ICD-10-CM | POA: Diagnosis not present

## 2023-07-14 DIAGNOSIS — Z7982 Long term (current) use of aspirin: Secondary | ICD-10-CM | POA: Insufficient documentation

## 2023-07-14 DIAGNOSIS — Z01812 Encounter for preprocedural laboratory examination: Secondary | ICD-10-CM

## 2023-07-14 DIAGNOSIS — Z79899 Other long term (current) drug therapy: Secondary | ICD-10-CM | POA: Insufficient documentation

## 2023-07-14 DIAGNOSIS — I10 Essential (primary) hypertension: Secondary | ICD-10-CM | POA: Insufficient documentation

## 2023-07-14 DIAGNOSIS — Z7984 Long term (current) use of oral hypoglycemic drugs: Secondary | ICD-10-CM | POA: Diagnosis not present

## 2023-07-14 DIAGNOSIS — Z96641 Presence of right artificial hip joint: Secondary | ICD-10-CM

## 2023-07-14 DIAGNOSIS — N950 Postmenopausal bleeding: Secondary | ICD-10-CM | POA: Insufficient documentation

## 2023-07-14 DIAGNOSIS — M1611 Unilateral primary osteoarthritis, right hip: Principal | ICD-10-CM | POA: Insufficient documentation

## 2023-07-14 DIAGNOSIS — E119 Type 2 diabetes mellitus without complications: Secondary | ICD-10-CM | POA: Diagnosis not present

## 2023-07-14 HISTORY — PX: TOTAL HIP ARTHROPLASTY: SHX124

## 2023-07-14 LAB — GLUCOSE, CAPILLARY
Glucose-Capillary: 114 mg/dL — ABNORMAL HIGH (ref 70–99)
Glucose-Capillary: 185 mg/dL — ABNORMAL HIGH (ref 70–99)
Glucose-Capillary: 188 mg/dL — ABNORMAL HIGH (ref 70–99)
Glucose-Capillary: 179 mg/dL — ABNORMAL HIGH (ref 70–99)

## 2023-07-14 SURGERY — ARTHROPLASTY, HIP, TOTAL,POSTERIOR APPROACH
Anesthesia: Spinal | Site: Hip | Laterality: Right

## 2023-07-14 MED ORDER — LIDOCAINE HCL (CARDIAC) PF 100 MG/5ML IV SOSY
PREFILLED_SYRINGE | INTRAVENOUS | Status: DC | PRN
  Administered 2023-07-14: 30 mg via INTRAVENOUS

## 2023-07-14 MED ORDER — METFORMIN HCL 500 MG PO TABS
ORAL_TABLET | ORAL | Status: AC
  Filled 2023-07-14: qty 2

## 2023-07-14 MED ORDER — OXYCODONE HCL 5 MG/5ML PO SOLN: 5.0000 mg | Freq: Once | ORAL | Status: AC | PRN

## 2023-07-14 MED ORDER — EPHEDRINE SULFATE (PRESSORS) 50 MG/ML IJ SOLN
INTRAMUSCULAR | Status: DC | PRN
  Administered 2023-07-14: 5 mg via INTRAVENOUS
  Administered 2023-07-14: 10 mg via INTRAVENOUS

## 2023-07-14 MED ORDER — ALBUTEROL SULFATE (2.5 MG/3ML) 0.083% IN NEBU: 3.0000 mL | INHALATION_SOLUTION | Freq: Four times a day (QID) | RESPIRATORY_TRACT | Status: DC | PRN

## 2023-07-14 MED ORDER — CEFAZOLIN SODIUM-DEXTROSE 2-4 GM/100ML-% IV SOLN
INTRAVENOUS | Status: AC
  Filled 2023-07-14: qty 100

## 2023-07-14 MED ORDER — FENTANYL CITRATE (PF) 100 MCG/2ML IJ SOLN
INTRAMUSCULAR | Status: AC
  Filled 2023-07-14: qty 2

## 2023-07-14 MED ORDER — PROPOFOL 1000 MG/100ML IV EMUL
INTRAVENOUS | Status: AC
  Filled 2023-07-14: qty 100

## 2023-07-14 MED ORDER — SENNOSIDES-DOCUSATE SODIUM 8.6-50 MG PO TABS
1.0000 | ORAL_TABLET | Freq: Two times a day (BID) | ORAL | Status: DC
  Administered 2023-07-14 – 2023-07-15 (×2): 1 via ORAL

## 2023-07-14 MED ORDER — FLEET ENEMA RE ENEM: 1.0000 | ENEMA | Freq: Once | RECTAL | Status: DC | PRN

## 2023-07-14 MED ORDER — 0.9 % SODIUM CHLORIDE (POUR BTL) OPTIME
TOPICAL | Status: DC | PRN
  Administered 2023-07-14: 500 mL

## 2023-07-14 MED ORDER — PROPOFOL 10 MG/ML IV BOLUS
INTRAVENOUS | Status: DC | PRN
  Administered 2023-07-14: 30 mg via INTRAVENOUS

## 2023-07-14 MED ORDER — ACETAMINOPHEN 325 MG PO TABS: 325.0000 mg | ORAL_TABLET | Freq: Four times a day (QID) | ORAL | Status: DC | PRN

## 2023-07-14 MED ORDER — TRANEXAMIC ACID-NACL 1000-0.7 MG/100ML-% IV SOLN
INTRAVENOUS | Status: AC
  Filled 2023-07-14: qty 100

## 2023-07-14 MED ORDER — MAGNESIUM 400 MG PO TABS: 250.0000 mg | ORAL_TABLET | Freq: Every day | ORAL | Status: DC

## 2023-07-14 MED ORDER — IRBESARTAN 75 MG PO TABS
75.0000 mg | ORAL_TABLET | Freq: Every day | ORAL | Status: DC
  Filled 2023-07-14 (×2): qty 1

## 2023-07-14 MED ORDER — CELECOXIB 200 MG PO CAPS
ORAL_CAPSULE | ORAL | Status: AC
  Filled 2023-07-14: qty 1

## 2023-07-14 MED ORDER — DEXAMETHASONE SODIUM PHOSPHATE 10 MG/ML IJ SOLN
INTRAMUSCULAR | Status: AC
  Filled 2023-07-14: qty 1

## 2023-07-14 MED ORDER — TRAMADOL HCL 50 MG PO TABS
50.0000 mg | ORAL_TABLET | ORAL | Status: DC | PRN
  Administered 2023-07-14 – 2023-07-15 (×3): 50 mg via ORAL

## 2023-07-14 MED ORDER — TRAMADOL HCL 50 MG PO TABS
ORAL_TABLET | ORAL | Status: AC
  Filled 2023-07-14: qty 1

## 2023-07-14 MED ORDER — PHENYLEPHRINE HCL (PRESSORS) 10 MG/ML IV SOLN
INTRAVENOUS | Status: DC | PRN
  Administered 2023-07-14 (×3): 100 ug via INTRAVENOUS

## 2023-07-14 MED ORDER — ASPIRIN 81 MG PO CHEW
CHEWABLE_TABLET | ORAL | Status: AC
  Filled 2023-07-14: qty 1

## 2023-07-14 MED ORDER — SODIUM CHLORIDE 0.9 % IR SOLN
Status: DC | PRN
  Administered 2023-07-14: 3000 mL

## 2023-07-14 MED ORDER — ONDANSETRON HCL 4 MG/2ML IJ SOLN: 4.0000 mg | Freq: Four times a day (QID) | INTRAMUSCULAR | Status: DC | PRN

## 2023-07-14 MED ORDER — METOCLOPRAMIDE HCL 10 MG PO TABS
ORAL_TABLET | ORAL | Status: AC
  Filled 2023-07-14: qty 1

## 2023-07-14 MED ORDER — OXYCODONE HCL 5 MG PO TABS
ORAL_TABLET | ORAL | Status: AC
  Filled 2023-07-14: qty 1

## 2023-07-14 MED ORDER — FERROUS SULFATE 325 (65 FE) MG PO TABS
ORAL_TABLET | ORAL | Status: AC
  Filled 2023-07-14: qty 1

## 2023-07-14 MED ORDER — OXYCODONE HCL 5 MG PO TABS
5.0000 mg | ORAL_TABLET | Freq: Once | ORAL | Status: AC | PRN
  Administered 2023-07-14: 5 mg via ORAL

## 2023-07-14 MED ORDER — ACETAMINOPHEN 10 MG/ML IV SOLN
INTRAVENOUS | Status: AC
  Filled 2023-07-14: qty 100

## 2023-07-14 MED ORDER — FENTANYL CITRATE (PF) 100 MCG/2ML IJ SOLN
25.0000 ug | INTRAMUSCULAR | Status: DC | PRN
  Administered 2023-07-14 (×6): 25 ug via INTRAVENOUS

## 2023-07-14 MED ORDER — BISACODYL 10 MG RE SUPP: 10.0000 mg | Freq: Every day | RECTAL | Status: DC | PRN

## 2023-07-14 MED ORDER — PHENYLEPHRINE HCL-NACL 20-0.9 MG/250ML-% IV SOLN
INTRAVENOUS | Status: AC
  Filled 2023-07-14: qty 250

## 2023-07-14 MED ORDER — INSULIN ASPART 100 UNIT/ML IJ SOLN: 0.0000 [IU] | Freq: Every day | INTRAMUSCULAR | Status: DC

## 2023-07-14 MED ORDER — PROPOFOL 500 MG/50ML IV EMUL
INTRAVENOUS | Status: DC | PRN
  Administered 2023-07-14: 60 ug/kg/min via INTRAVENOUS

## 2023-07-14 MED ORDER — GABAPENTIN 300 MG PO CAPS
ORAL_CAPSULE | ORAL | Status: AC
  Filled 2023-07-14: qty 1

## 2023-07-14 MED ORDER — ACETAMINOPHEN 10 MG/ML IV SOLN: 1000.0000 mg | Freq: Once | INTRAVENOUS | Status: DC | PRN

## 2023-07-14 MED ORDER — ORAL CARE MOUTH RINSE: 15.0000 mL | OROMUCOSAL | Status: DC | PRN

## 2023-07-14 MED ORDER — OXYCODONE HCL 5 MG PO TABS: 10.0000 mg | ORAL_TABLET | ORAL | Status: DC | PRN

## 2023-07-14 MED ORDER — CEFAZOLIN SODIUM-DEXTROSE 2-4 GM/100ML-% IV SOLN
2.0000 g | Freq: Four times a day (QID) | INTRAVENOUS | Status: AC
  Administered 2023-07-14 (×2): 2 g via INTRAVENOUS

## 2023-07-14 MED ORDER — ENSURE PRE-SURGERY PO LIQD
296.0000 mL | Freq: Once | ORAL | Status: AC
  Administered 2023-07-14: 296 mL via ORAL
  Filled 2023-07-14: qty 296

## 2023-07-14 MED ORDER — ONDANSETRON HCL 4 MG PO TABS: 4.0000 mg | ORAL_TABLET | Freq: Four times a day (QID) | ORAL | Status: DC | PRN

## 2023-07-14 MED ORDER — RISAQUAD PO CAPS
1.0000 | ORAL_CAPSULE | Freq: Every day | ORAL | Status: DC
  Administered 2023-07-15: 1 via ORAL
  Filled 2023-07-14: qty 1

## 2023-07-14 MED ORDER — HYDROMORPHONE HCL 1 MG/ML IJ SOLN
0.2500 mg | INTRAMUSCULAR | Status: DC | PRN
  Administered 2023-07-14 (×3): 0.25 mg via INTRAVENOUS

## 2023-07-14 MED ORDER — METFORMIN HCL 500 MG PO TABS
1000.0000 mg | ORAL_TABLET | Freq: Two times a day (BID) | ORAL | Status: DC
  Administered 2023-07-15: 1000 mg via ORAL

## 2023-07-14 MED ORDER — PANTOPRAZOLE SODIUM 40 MG PO TBEC
40.0000 mg | DELAYED_RELEASE_TABLET | Freq: Two times a day (BID) | ORAL | Status: DC
  Administered 2023-07-14 – 2023-07-15 (×2): 40 mg via ORAL

## 2023-07-14 MED ORDER — ACETAMINOPHEN 10 MG/ML IV SOLN: 1000.0000 mg | Freq: Four times a day (QID) | INTRAVENOUS | Status: DC

## 2023-07-14 MED ORDER — INSULIN ASPART 100 UNIT/ML IJ SOLN
0.0000 [IU] | Freq: Three times a day (TID) | INTRAMUSCULAR | Status: DC
  Administered 2023-07-14: 3 [IU] via SUBCUTANEOUS
  Administered 2023-07-15: 2 [IU] via SUBCUTANEOUS

## 2023-07-14 MED ORDER — HYDROCHLOROTHIAZIDE 12.5 MG PO TABS
6.2500 mg | ORAL_TABLET | Freq: Every day | ORAL | Status: DC
  Filled 2023-07-14 (×2): qty 1

## 2023-07-14 MED ORDER — SENNOSIDES-DOCUSATE SODIUM 8.6-50 MG PO TABS
ORAL_TABLET | ORAL | Status: AC
  Filled 2023-07-14: qty 1

## 2023-07-14 MED ORDER — PANTOPRAZOLE SODIUM 40 MG PO TBEC
DELAYED_RELEASE_TABLET | ORAL | Status: AC
  Filled 2023-07-14: qty 1

## 2023-07-14 MED ORDER — DROPERIDOL 2.5 MG/ML IJ SOLN: 0.6250 mg | Freq: Once | INTRAMUSCULAR | Status: DC | PRN

## 2023-07-14 MED ORDER — BUPIVACAINE HCL (PF) 0.5 % IJ SOLN
INTRAMUSCULAR | Status: DC | PRN
  Administered 2023-07-14: 2.8 mL via INTRATHECAL

## 2023-07-14 MED ORDER — MAGNESIUM HYDROXIDE 400 MG/5ML PO SUSP
ORAL | Status: AC
  Filled 2023-07-14: qty 30

## 2023-07-14 MED ORDER — PHENOL 1.4 % MT LIQD: 1.0000 | OROMUCOSAL | Status: DC | PRN

## 2023-07-14 MED ORDER — OXYCODONE HCL 5 MG PO TABS: 5.0000 mg | ORAL_TABLET | ORAL | Status: DC | PRN

## 2023-07-14 MED ORDER — ASPIRIN 81 MG PO CHEW
81.0000 mg | CHEWABLE_TABLET | Freq: Two times a day (BID) | ORAL | Status: DC
  Administered 2023-07-14 – 2023-07-15 (×2): 81 mg via ORAL

## 2023-07-14 MED ORDER — SODIUM CHLORIDE 0.9 % IV SOLN: INTRAVENOUS | Status: DC

## 2023-07-14 MED ORDER — MENTHOL 3 MG MT LOZG: 1.0000 | LOZENGE | OROMUCOSAL | Status: DC | PRN

## 2023-07-14 MED ORDER — ALUM & MAG HYDROXIDE-SIMETH 200-200-20 MG/5ML PO SUSP: 30.0000 mL | ORAL | Status: DC | PRN

## 2023-07-14 MED ORDER — HYDROMORPHONE HCL 1 MG/ML IJ SOLN
INTRAMUSCULAR | Status: AC
  Filled 2023-07-14: qty 1

## 2023-07-14 MED ORDER — NIACIN ER (ANTIHYPERLIPIDEMIC) 500 MG PO TBCR
500.0000 mg | EXTENDED_RELEASE_TABLET | Freq: Every day | ORAL | Status: DC
  Administered 2023-07-14: 500 mg via ORAL
  Filled 2023-07-14: qty 1

## 2023-07-14 MED ORDER — METOCLOPRAMIDE HCL 10 MG PO TABS
10.0000 mg | ORAL_TABLET | Freq: Three times a day (TID) | ORAL | Status: DC
  Administered 2023-07-14 – 2023-07-15 (×3): 10 mg via ORAL

## 2023-07-14 MED ORDER — ACETAMINOPHEN 10 MG/ML IV SOLN
1000.0000 mg | Freq: Four times a day (QID) | INTRAVENOUS | Status: AC
  Administered 2023-07-14 – 2023-07-15 (×3): 1000 mg via INTRAVENOUS

## 2023-07-14 MED ORDER — CHLORHEXIDINE GLUCONATE 0.12 % MT SOLN
OROMUCOSAL | Status: AC
  Filled 2023-07-14: qty 15

## 2023-07-14 MED ORDER — SURGIPHOR WOUND IRRIGATION SYSTEM - OPTIME: TOPICAL | Status: DC | PRN

## 2023-07-14 MED ORDER — CELECOXIB 200 MG PO CAPS
200.0000 mg | ORAL_CAPSULE | Freq: Two times a day (BID) | ORAL | Status: DC
  Administered 2023-07-14 – 2023-07-15 (×2): 200 mg via ORAL

## 2023-07-14 MED ORDER — MOMETASONE FURO-FORMOTEROL FUM 200-5 MCG/ACT IN AERO
2.0000 | INHALATION_SPRAY | Freq: Two times a day (BID) | RESPIRATORY_TRACT | Status: DC
  Administered 2023-07-15: 2 via RESPIRATORY_TRACT
  Filled 2023-07-14: qty 8.8

## 2023-07-14 MED ORDER — ACETAMINOPHEN 10 MG/ML IV SOLN
INTRAVENOUS | Status: DC | PRN
  Administered 2023-07-14: 1000 mg via INTRAVENOUS

## 2023-07-14 MED ORDER — ONDANSETRON HCL 4 MG/2ML IJ SOLN
INTRAMUSCULAR | Status: AC
  Filled 2023-07-14: qty 2

## 2023-07-14 MED ORDER — MONTELUKAST SODIUM 10 MG PO TABS
10.0000 mg | ORAL_TABLET | Freq: Every day | ORAL | Status: DC
  Administered 2023-07-14: 10 mg via ORAL
  Filled 2023-07-14: qty 1

## 2023-07-14 MED ORDER — TRANEXAMIC ACID-NACL 1000-0.7 MG/100ML-% IV SOLN
1000.0000 mg | Freq: Once | INTRAVENOUS | Status: AC
  Administered 2023-07-14: 1000 mg via INTRAVENOUS

## 2023-07-14 MED ORDER — HYDROMORPHONE HCL 1 MG/ML IJ SOLN: 0.5000 mg | INTRAMUSCULAR | Status: DC | PRN

## 2023-07-14 MED ORDER — FERROUS SULFATE 325 (65 FE) MG PO TABS
325.0000 mg | ORAL_TABLET | Freq: Two times a day (BID) | ORAL | Status: DC
  Administered 2023-07-14 – 2023-07-15 (×2): 325 mg via ORAL

## 2023-07-14 MED ORDER — LORATADINE 10 MG PO TABS
10.0000 mg | ORAL_TABLET | Freq: Every day | ORAL | Status: DC
  Administered 2023-07-14 – 2023-07-15 (×2): 10 mg via ORAL

## 2023-07-14 MED ORDER — GABAPENTIN 300 MG PO CAPS
300.0000 mg | ORAL_CAPSULE | Freq: Four times a day (QID) | ORAL | Status: DC
  Administered 2023-07-14 – 2023-07-15 (×3): 300 mg via ORAL

## 2023-07-14 MED ORDER — MAGNESIUM HYDROXIDE 400 MG/5ML PO SUSP
30.0000 mL | Freq: Every day | ORAL | Status: DC
  Administered 2023-07-14: 30 mL via ORAL

## 2023-07-14 MED ORDER — DIPHENHYDRAMINE HCL 12.5 MG/5ML PO ELIX: 12.5000 mg | ORAL_SOLUTION | ORAL | Status: DC | PRN

## 2023-07-14 MED ORDER — AZELASTINE HCL 0.1 % NA SOLN: 1.0000 | Freq: Every day | NASAL | Status: DC | PRN

## 2023-07-14 MED ORDER — ONDANSETRON HCL 4 MG/2ML IJ SOLN
INTRAMUSCULAR | Status: DC | PRN
  Administered 2023-07-14: 4 mg via INTRAVENOUS

## 2023-07-14 MED ORDER — INSULIN ASPART 100 UNIT/ML IJ SOLN
INTRAMUSCULAR | Status: AC
  Filled 2023-07-14: qty 1

## 2023-07-14 MED ORDER — FENTANYL CITRATE (PF) 100 MCG/2ML IJ SOLN
INTRAMUSCULAR | Status: DC | PRN
  Administered 2023-07-14 (×2): 50 ug via INTRAVENOUS

## 2023-07-14 MED ORDER — PHENYLEPHRINE HCL-NACL 20-0.9 MG/250ML-% IV SOLN
INTRAVENOUS | Status: DC | PRN
  Administered 2023-07-14: 30 ug/min via INTRAVENOUS

## 2023-07-14 MED ORDER — MAGNESIUM OXIDE -MG SUPPLEMENT 400 (240 MG) MG PO TABS
400.0000 mg | ORAL_TABLET | Freq: Every day | ORAL | Status: DC
  Administered 2023-07-15: 400 mg via ORAL
  Filled 2023-07-14: qty 1

## 2023-07-14 MED ORDER — SIMVASTATIN 40 MG PO TABS
40.0000 mg | ORAL_TABLET | Freq: Every evening | ORAL | Status: DC
  Administered 2023-07-14: 40 mg via ORAL
  Filled 2023-07-14 (×2): qty 1

## 2023-07-14 MED ORDER — TELMISARTAN-HCTZ 40-12.5 MG PO TABS: 0.5000 | ORAL_TABLET | Freq: Every day | ORAL | Status: DC

## 2023-07-14 MED ORDER — LORATADINE 10 MG PO TABS
ORAL_TABLET | ORAL | Status: AC
  Filled 2023-07-14: qty 1

## 2023-07-14 SURGICAL SUPPLY — 53 items
BLADE SAW 90X25X1.19 OSCILLAT (BLADE) ×1 IMPLANT
BRUSH SCRUB EZ PLAIN DRY (MISCELLANEOUS) ×1 IMPLANT
CUP SECTOR GRIPTON 50MM (Cup) IMPLANT
DRAPE INCISE IOBAN 66X60 STRL (DRAPES) ×1 IMPLANT
DRAPE SHEET LG 3/4 BI-LAMINATE (DRAPES) ×1 IMPLANT
DRSG AQUACEL AG ADV 3.5X14 (GAUZE/BANDAGES/DRESSINGS) ×1 IMPLANT
DRSG MEPILEX SACRM 8.7X9.8 (GAUZE/BANDAGES/DRESSINGS) ×1 IMPLANT
DRSG NON-ADHERENT DERMACEA 3X4 (GAUZE/BANDAGES/DRESSINGS) ×1 IMPLANT
DRSG TEGADERM 4X4.75 (GAUZE/BANDAGES/DRESSINGS) ×1 IMPLANT
DURAPREP 26ML APPLICATOR (WOUND CARE) ×2 IMPLANT
ELECT CAUTERY BLADE 6.4 (BLADE) ×1 IMPLANT
ELECT REM PT RETURN 9FT ADLT (ELECTROSURGICAL) ×1
ELECTRODE REM PT RTRN 9FT ADLT (ELECTROSURGICAL) ×1 IMPLANT
EVACUATOR 1/8 PVC DRAIN (DRAIN) ×1 IMPLANT
GLOVE BIOGEL M STRL SZ7.5 (GLOVE) ×4 IMPLANT
GLOVE SRG 8 PF TXTR STRL LF DI (GLOVE) ×2 IMPLANT
GLOVE SURG UNDER POLY LF SZ8 (GLOVE) ×2
GOWN STRL REUS W/ TWL LRG LVL3 (GOWN DISPOSABLE) ×2 IMPLANT
GOWN STRL REUS W/ TWL XL LVL3 (GOWN DISPOSABLE) ×1 IMPLANT
GOWN STRL REUS W/TWL LRG LVL3 (GOWN DISPOSABLE) ×2
GOWN STRL REUS W/TWL XL LVL3 (GOWN DISPOSABLE) ×1
GOWN TOGA ZIPPER T7+ PEEL AWAY (MISCELLANEOUS) ×1 IMPLANT
HANDLE YANKAUER SUCT OPEN TIP (MISCELLANEOUS) ×1 IMPLANT
HEAD FEM STD 32X+1 STRL (Hips) IMPLANT
HOLDER FOLEY CATH W/STRAP (MISCELLANEOUS) ×1 IMPLANT
HOOD PEEL AWAY T7 (MISCELLANEOUS) ×1 IMPLANT
IV NS IRRIG 3000ML ARTHROMATIC (IV SOLUTION) ×1 IMPLANT
KIT PEG BOARD PINK (KITS) ×1 IMPLANT
KIT TURNOVER KIT A (KITS) ×1 IMPLANT
LINER ACET PNNCL PLUS4 NEUTRAL (Hips) IMPLANT
MANIFOLD NEPTUNE II (INSTRUMENTS) ×2 IMPLANT
NS IRRIG 500ML POUR BTL (IV SOLUTION) ×1 IMPLANT
PACK HIP PROSTHESIS (MISCELLANEOUS) ×1 IMPLANT
PIN STEIN THRED 5/32 (Pin) IMPLANT
PINNACLE PLUS 4 NEUTRAL (Hips) ×1 IMPLANT
PULSAVAC PLUS IRRIG FAN TIP (DISPOSABLE) ×1
SCREW PINN CAN 6.5X20 (Screw) IMPLANT
SOLUTION IRRIG SURGIPHOR (IV SOLUTION) ×1 IMPLANT
SPONGE DRAIN TRACH 4X4 STRL 2S (GAUZE/BANDAGES/DRESSINGS) ×1 IMPLANT
STAPLER SKIN PROX 35W (STAPLE) ×1 IMPLANT
STEM FEM ACTIS STD SZ2 (Stem) IMPLANT
SUT ETHIBOND #5 BRAIDED 30INL (SUTURE) ×1 IMPLANT
SUT VIC AB 0 CT1 36 (SUTURE) ×2 IMPLANT
SUT VIC AB 1 CT1 36 (SUTURE) ×2 IMPLANT
SUT VIC AB 2-0 CT1 27 (SUTURE) ×1
SUT VIC AB 2-0 CT1 TAPERPNT 27 (SUTURE) ×1 IMPLANT
TAPE CLOTH 3X10 WHT NS LF (GAUZE/BANDAGES/DRESSINGS) ×1 IMPLANT
TIP FAN IRRIG PULSAVAC PLUS (DISPOSABLE) ×1 IMPLANT
TOWEL OR 17X26 4PK STRL BLUE (TOWEL DISPOSABLE) IMPLANT
TRAP FLUID SMOKE EVACUATOR (MISCELLANEOUS) ×1 IMPLANT
TRAY FOLEY MTR SLVR 16FR STAT (SET/KITS/TRAYS/PACK) ×1 IMPLANT
TUBING CONNECTING 10 (TUBING) ×2 IMPLANT
WATER STERILE IRR 1000ML POUR (IV SOLUTION) ×1 IMPLANT

## 2023-07-14 NOTE — Transfer of Care (Signed)
  Immediate Anesthesia Transfer of Care Note  Patient: Amanda Watson  Procedure(s) Performed: TOTAL HIP ARTHROPLASTY (Right: Hip)  Patient Location: PACU  Anesthesia Type:Spinal  Level of Consciousness: drowsy  Airway & Oxygen Therapy: Patient Spontanous Breathing and Patient connected to face mask oxygen  Post-op Assessment: Report given to RN  Post vital signs: stable  Last Vitals:  Vitals Value Taken Time  BP    Temp    Pulse    Resp    SpO2      Last Pain:  Vitals:   07/14/23 0632  TempSrc: Temporal  PainSc: 0-No pain      Patients Stated Pain Goal: 0 (07/14/23 3086)  Complications: No notable events documented.

## 2023-07-14 NOTE — Anesthesia Procedure Notes (Signed)
Spinal  Patient location during procedure: OR Start time: 07/14/2023 7:27 AM End time: 07/14/2023 7:28 AM Reason for block: surgical anesthesia Staffing Performed: resident/CRNA  Resident/CRNA: Monico Hoar, CRNA Performed by: Monico Hoar, CRNA Authorized by: Monico Hoar, CRNA   Preanesthetic Checklist Completed: patient identified, IV checked, site marked, risks and benefits discussed, surgical consent, monitors and equipment checked, pre-op evaluation and timeout performed Spinal Block Patient position: sitting Prep: ChloraPrep Patient monitoring: heart rate, continuous pulse ox, blood pressure and cardiac monitor Approach: midline Location: L4-5 Injection technique: single-shot Needle Needle type: Introducer and Pencan  Needle gauge: 24 G Needle length: 10 cm Assessment Sensory level: T6 Events: CSF return Additional Notes Negative paresthesia. Negative blood return. Positive free-flowing CSF. Expiration date of kit checked and confirmed. Patient tolerated procedure well, without complications.

## 2023-07-14 NOTE — Progress Notes (Signed)
Patient is not able to walk the distance required to go the bathroom, or he/she is unable to safely negotiate stairs required to access the bathroom.  A 3in1 BSC will alleviate this problem   Amanda Watson, Jr. M.D.  

## 2023-07-14 NOTE — Interval H&P Note (Signed)
History and Physical Interval Note:  07/14/2023 6:25 AM  Amanda Watson  has presented today for surgery, with the diagnosis of PRIMARY OSTEOARTHRITIS OF RIGHT HIP.Marland Kitchen  The various methods of treatment have been discussed with the patient and family. After consideration of risks, benefits and other options for treatment, the patient has consented to  Procedure(s): TOTAL HIP ARTHROPLASTY (Right) as a surgical intervention.  The patient's history has been reviewed, patient examined, no change in status, stable for surgery.  I have reviewed the patient's chart and labs.  Questions were answered to the patient's satisfaction.     Angelica Wix P Jakeb Lamping

## 2023-07-14 NOTE — Evaluation (Signed)
Physical Therapy Evaluation Patient Details Name: Amanda Watson MRN: [id number] DOB: 1950-10-29 Today's Date: 07/14/2023  History of Present Illness  Pt is a 72 y/o F admitted on 07/14/23 for scheduled R THA. PMH: asthma, DM2, HTN, hypercholesterolemia, psoriasis, sleep apnea on CPAP, spinal stenosis  Clinical Impression  Pt seen for PT evaluation with pt agreeable to tx. PT provided pt with HEP handout & pt performed RLE strengthening exercises with cuing for technique, AAROM PRN. Pt is able to progress to sitting EOB and standing EOB. Upon sitting EOB pt with c/o feeling "so-so" & pt noted to have decrease in BP but increased with extra time.  Attempted to wean pt to room air with SpO2 >/= 90% on room air but dropped to 82% when returned to semi fowler in bed so pt placed back on 2L/min with improvement in SpO2. Will continue to see pt in acute setting to address strengthening, balance, & gait.        If plan is discharge home, recommend the following: A little help with walking and/or transfers;A little help with bathing/dressing/bathroom;Assistance with cooking/housework;Assist for transportation;Help with stairs or ramp for entrance   Can travel by private vehicle        Equipment Recommendations BSC/3in1  Recommendations for Other Services       Functional Status Assessment Patient has had a recent decline in their functional status and demonstrates the ability to make significant improvements in function in a reasonable and predictable amount of time.     Precautions / Restrictions Precautions Precautions: Fall;Posterior Hip Restrictions Weight Bearing Restrictions: Yes RLE Weight Bearing: Weight bearing as tolerated      Mobility  Bed Mobility Overal bed mobility: Needs Assistance Bed Mobility: Supine to Sit, Sit to Supine     Supine to sit: Min assist, HOB elevated, Used rails Sit to supine: Mod assist, Used rails, HOB elevated        Transfers Overall transfer  level: Needs assistance Equipment used: Rolling walker (2 wheels) Transfers: Sit to/from Stand Sit to Stand: Min assist           General transfer comment: STS from EOB with cuing but poor return demo of hand placement    Ambulation/Gait               General Gait Details: Pt attempts 1-2 side steps to R along EOB with min assist but decreased weight shifting & increased pain with weight bearing RLE.  Stairs            Wheelchair Mobility     Tilt Bed    Modified Rankin (Stroke Patients Only)       Balance Overall balance assessment: Needs assistance Sitting-balance support: Feet supported Sitting balance-Leahy Scale: Fair     Standing balance support: Bilateral upper extremity supported, During functional activity, Reliant on assistive device for balance Standing balance-Leahy Scale: Poor                               Pertinent Vitals/Pain Pain Assessment Pain Assessment: Faces Faces Pain Scale: Hurts even more Pain Location: R hip Pain Descriptors / Indicators: Discomfort, Grimacing, Guarding Pain Intervention(s): Monitored during session, Limited activity within patient's tolerance (ice pack utilized at beginning & end of session)    Home Living Family/patient expects to be discharged to:: Private residence Living Arrangements: Spouse/significant other Available Help at Discharge: Family Type of Home: Mobile home Home Access: Ramped entrance  Home Layout: One level Home Equipment: Agricultural consultant (2 wheels);Cane - single point      Prior Function Prior Level of Function : Driving;Independent/Modified Independent             Mobility Comments: Ambulatory with SPC, denies falls. ADLs Comments: Assistance with donning socks 2/2 decreased ROM.     Extremity/Trunk Assessment   Upper Extremity Assessment Upper Extremity Assessment: Overall WFL for tasks assessed    Lower Extremity Assessment Lower Extremity  Assessment: RLE deficits/detail RLE Deficits / Details: 2+/5 knee extension in sitting    Cervical / Trunk Assessment Cervical / Trunk Assessment: Normal  Communication   Communication Communication: No apparent difficulties  Cognition Arousal: Alert Behavior During Therapy: WFL for tasks assessed/performed Overall Cognitive Status: Within Functional Limits for tasks assessed                                 General Comments: pt sleepy 2/2 pain meds & recent sx        General Comments      Exercises Total Joint Exercises Ankle Circles/Pumps: AROM, Supine, Right, 10 reps Quad Sets: AROM, Supine, Right, Strengthening, 10 reps Gluteal Sets: Supine, AROM, Strengthening, 10 reps Towel Squeeze: Supine, AROM, Strengthening, 10 reps (hip adduction squeezes) Short Arc Quad: AROM, Supine, Strengthening, Right, 10 reps Heel Slides: Supine, AAROM, Strengthening, Right, 10 reps Hip ABduction/ADduction: Supine, AAROM, Strengthening, Right, 10 reps (hip abduction slides) Long Arc Quad: AROM, Seated, Strengthening, Right, 10 reps   Assessment/Plan    PT Assessment Patient needs continued PT services  PT Problem List Decreased strength;Cardiopulmonary status limiting activity;Pain;Decreased range of motion;Decreased activity tolerance;Decreased balance;Decreased mobility;Decreased safety awareness;Decreased knowledge of use of DME;Decreased knowledge of precautions       PT Treatment Interventions DME instruction;Balance training;Gait training;Neuromuscular re-education;Modalities;Stair training;Functional mobility training;Patient/family education;Therapeutic activities;Manual techniques;Therapeutic exercise    PT Goals (Current goals can be found in the Care Plan section)  Acute Rehab PT Goals Patient Stated Goal: get better PT Goal Formulation: With patient Time For Goal Achievement: 07/28/23 Potential to Achieve Goals: Good    Frequency BID     Co-evaluation                AM-PAC PT "6 Clicks" Mobility  Outcome Measure Help needed turning from your back to your side while in a flat bed without using bedrails?: A Little Help needed moving from lying on your back to sitting on the side of a flat bed without using bedrails?: A Little Help needed moving to and from a bed to a chair (including a wheelchair)?: A Little Help needed standing up from a chair using your arms (e.g., wheelchair or bedside chair)?: A Little Help needed to walk in hospital room?: A Lot Help needed climbing 3-5 steps with a railing? : Total 6 Click Score: 15    End of Session   Activity Tolerance: Patient tolerated treatment well;Patient limited by pain;Patient limited by fatigue Patient left: in bed;with call bell/phone within reach;with bed alarm set Nurse Communication: Mobility status (O2) PT Visit Diagnosis: Pain;Muscle weakness (generalized) (M62.81);Other abnormalities of gait and mobility (R26.89);Difficulty in walking, not elsewhere classified (R26.2);Unsteadiness on feet (R26.81) Pain - Right/Left: Right Pain - part of body: Hip    Time: 2956-2130 PT Time Calculation (min) (ACUTE ONLY): 34 min   Charges:   PT Evaluation $PT Eval Low Complexity: 1 Low PT Treatments $Therapeutic Exercise: 8-22 mins PT General Charges $$ ACUTE  PT VISIT: 1 Visit         Aleda Grana, PT, DPT 07/14/23, 1:53 PM   Sandi Mariscal 07/14/2023, 1:51 PM

## 2023-07-14 NOTE — Op Note (Addendum)
OPERATIVE NOTE  DATE OF SURGERY:  07/14/2023  PATIENT NAME:  Amanda Watson   DOB: October 05, 1951  MRN: [id number]  PRE-OPERATIVE DIAGNOSIS: Degenerative arthrosis of the right hip, primary  POST-OPERATIVE DIAGNOSIS:  Same  PROCEDURE:  Right total hip arthroplasty  SURGEON:  Jena Gauss. M.D.  ASSISTANT:  Gean Birchwood, PA-C (present and scrubbed throughout the case, critical for assistance with exposure, retraction, instrumentation, and closure)  ANESTHESIA: spinal  ESTIMATED BLOOD LOSS: 100 mL  FLUIDS REPLACED: 600 mL of crystalloid  DRAINS: 2 medium Hemovac drains  IMPLANTS UTILIZED: DePuy size 2 standard offset Actis femoral stem, 50 mm OD Pinnacle GRIPTION Sector acetabular component, 6.5 x 20 mm cancellous screw, +4 mm neutral Pinnacle Altrx polyethylene insert, and a 32 mm CoCr +1 mm hip ball  INDICATIONS FOR SURGERY: Amanda Watson is a 72 y.o. year old female with a long history of progressive hip and groin  pain. X-rays demonstrated severe degenerative changes. The patient had not seen any significant improvement despite conservative nonsurgical intervention. After discussion of the risks and benefits of surgical intervention, the patient expressed understanding of the risks benefits and agree with plans for total hip arthroplasty.   The risks, benefits, and alternatives were discussed at length including but not limited to the risks of infection, bleeding, nerve injury, stiffness, blood clots, the need for revision surgery, limb length inequality, dislocation, cardiopulmonary complications, among others, and they were willing to proceed.  PROCEDURE IN DETAIL: The patient was brought into the operating room and, after adequate spinal anesthesia was achieved, the patient was placed in a left lateral decubitus position. Axillary roll was placed and all bony prominences were well-padded. The patient's right hip was cleaned and prepped with alcohol and DuraPrep and draped in the  usual sterile fashion. A "timeout" was performed as per usual protocol. A lateral curvilinear incision was made gently curving towards the posterior superior iliac spine. The IT band was incised in line with the skin incision and the fibers of the gluteus maximus were split in line. The piriformis tendon was identified, skeletonized, and incised at its insertion to the proximal femur and reflected posteriorly. A T type posterior capsulotomy was performed. Prior to dislocation of the femoral head, a threaded Steinmann pin was inserted through a separate stab incision into the pelvis superior to the acetabulum and bent in the form of a stylus so as to assess limb length and hip offset throughout the procedure.  In order to better mobilize the proximal femur, was elected to incise the gluteal sling.  The femoral head was then dislocated posteriorly. Inspection of the femoral head demonstrated severe degenerative changes with full-thickness loss of articular cartilage. The femoral neck cut was performed using an oscillating saw. The anterior capsule was elevated off of the femoral neck using a periosteal elevator. Attention was then directed to the acetabulum. The remnant of the labrum was excised using electrocautery. Inspection of the acetabulum also demonstrated significant degenerative changes. The acetabulum was reamed in sequential fashion up to a 49 mm diameter. Good punctate bleeding bone was encountered. A 50 mm Pinnacle GRIPTION Sector acetabular component was positioned and impacted into place. Good scratch fit was appreciated a 6.5 mm x 20 mm cancellous screw was inserted through the dome hole for additional fixation.. A +4 mm neutral polyethylene trial was inserted.  Attention was then directed to the proximal femur.  Femoral broaches were inserted in a sequential fashion up to a size 2 broach. Calcar region was  planed and a trial reduction was performed using a standard offset neck and a 32 mm hip ball  with a +1 mm neck length. Good equalization of limb lengths and hip offset was appreciated and excellent stability was noted both anteriorly and posteriorly. Trial components were removed. The acetabular shell was irrigated with copious amounts of normal saline with antibiotic solution and suctioned dry. A +4 mm neutral Pinnacle Altrx polyethylene insert was positioned and impacted into place. Next, a size 2 standard offset Actis femoral stem was positioned and impacted into place. Excellent scratch fit was appreciated. A trial reduction was again performed with a 32 mm hip ball with a +1 mm neck length. Again, good equalization of limb lengths was appreciated and excellent stability appreciated both anteriorly and posteriorly. The hip was then dislocated and the trial hip ball was removed. The Morse taper was cleaned and dried. A 32 mm cobalt chrome hip ball with a +1 mm neck length was placed on the trunnion and impacted into place. The hip was then reduced and placed through range of motion. Excellent stability was appreciated both anteriorly and posteriorly.  The wound was irrigated with copious amounts of normal saline followed by 450 ml of Surgiphor and suctioned dry. Good hemostasis was appreciated. The posterior capsulotomy was repaired using #5 Ethibond. Piriformis tendon was reapproximated to the undersurface of the gluteus medius tendon using #5 Ethibond.  The gluteal sling was also reapproximated with interrupted sutures of #5 Ethibond.  The IT band was reapproximated using interrupted sutures of #1 Vicryl. Subcutaneous tissue was approximated using first #0 Vicryl followed by #2-0 Vicryl. The skin was closed with skin staples.  The patient tolerated the procedure well and was transported to the recovery room in stable condition.   Jena Gauss., M.D.

## 2023-07-14 NOTE — Anesthesia Preprocedure Evaluation (Signed)
Anesthesia Evaluation  Patient identified by MRN, date of birth, ID band Patient awake    Reviewed: Allergy & Precautions, H&P , NPO status , Patient's Chart, lab work & pertinent test results, reviewed documented beta blocker date and time   Airway Mallampati: III   Neck ROM: full    Dental  (+) Teeth Intact   Pulmonary asthma , sleep apnea and Continuous Positive Airway Pressure Ventilation , former smoker   Pulmonary exam normal        Cardiovascular Exercise Tolerance: Poor hypertension, On Medications negative cardio ROS Normal cardiovascular exam Rhythm:regular Rate:Normal     Neuro/Psych  Neuromuscular disease  negative psych ROS   GI/Hepatic Neg liver ROS,GERD  Medicated,,  Endo/Other  negative endocrine ROSdiabetes, Well Controlled, Type 2    Renal/GU negative Renal ROS  negative genitourinary   Musculoskeletal   Abdominal   Peds  Hematology  (+) Blood dyscrasia, anemia   Anesthesia Other Findings Past Medical History: No date: Anemia No date: Arthritis No date: Asthma 1999: Cancer (HCC)     Comment:  cervical ca No date: Diabetes mellitus without complication (HCC) No date: GERD (gastroesophageal reflux disease) No date: History of Bell's palsy No date: Hypercholesteremia No date: Hyperlipidemia No date: Hypertension No date: Multinodular goiter No date: Psoriasis No date: Sleep apnea     Comment:  uses CPAP No date: Spinal stenosis Past Surgical History: 03/22/2018: BROW LIFT; Bilateral     Comment:  Procedure: BLEPHAROPLASTY UPPER EYELID WITH EXCESS SKIN               DIABETIC;  Surgeon: Imagene Riches, MD;  Location: [facility];  Service: Ophthalmology;  Laterality:               Bilateral;  DIABETIC-oral meds No date: CARPAL TUNNEL RELEASE; Bilateral 12/01/2021: CATARACT EXTRACTION W/PHACO; Left     Comment:  Procedure: CATARACT EXTRACTION PHACO AND INTRAOCULAR                LENS PLACEMENT (IOC) LEFT DIABETIC;  Surgeon: Nevada Crane, MD;  Location: [facility];                Service: Ophthalmology;  Laterality: Left;  2.16 0:26.4 12/15/2021: CATARACT EXTRACTION W/PHACO; Right     Comment:  Procedure: CATARACT EXTRACTION PHACO AND INTRAOCULAR               LENS PLACEMENT (IOC) RIGHT DIABETIC 2.89 00:28.9;                Surgeon: Nevada Crane, MD;  Location: [facility];  Service: Ophthalmology;  Laterality:               Right;  Diabetic 09/1981: CESAREAN SECTION 1983: CHOLECYSTECTOMY 12/17/2020: COLONOSCOPY WITH PROPOFOL; N/A     Comment:  Procedure: COLONOSCOPY WITH PROPOFOL;  Surgeon:               Regis Bill, MD;  Location: [facility];                Service: Endoscopy;  Laterality: N/A; 2018: EYE SURGERY; Bilateral     Comment:  eyelid surgery 08/15/2020: HYSTERECTOMY ABDOMINAL WITH SALPINGECTOMY; Bilateral     Comment:  Procedure: HYSTERECTOMY  ABDOMINAL WITH BILATERAL                SALPINGO OOPHORECTOMY;  Surgeon: Schermerhorn, Ihor Austin,               MD;  Location: [facility];  Service: Gynecology;                Laterality: Bilateral; 06/14/2020: HYSTEROSCOPY WITH D & C; N/A     Comment:  Procedure: FRACTIONAL DILATATION AND CURETTAGE               /HYSTEROSCOPY;  Surgeon: Schermerhorn, Ihor Austin, MD;                Location: [facility];  Service: Gynecology;  Laterality:               N/A; No date: JOINT REPLACEMENT; Left     Comment:  hip No date: TOTAL HIP ARTHROPLASTY; Left 06/14/2015: UMBILICAL HERNIA REPAIR; N/A     Comment:  Procedure: HERNIA REPAIR UMBILICAL ADULT;  Surgeon:               Nadeen Landau, MD;  Location: [facility];  Service:               General;  Laterality: N/A; BMI    Body Mass Index: 43.55 kg/m     Reproductive/Obstetrics negative OB ROS                             Anesthesia Physical Anesthesia Plan  ASA:  3  Anesthesia Plan: Spinal   Post-op Pain Management:    Induction:   PONV Risk Score and Plan: 4 or greater  Airway Management Planned:   Additional Equipment:   Intra-op Plan:   Post-operative Plan:   Informed Consent: I have reviewed the patients History and Physical, chart, labs and discussed the procedure including the risks, benefits and alternatives for the proposed anesthesia with the patient or authorized representative who has indicated his/her understanding and acceptance.     Dental Advisory Given  Plan Discussed with: CRNA  Anesthesia Plan Comments:        Anesthesia Quick Evaluation

## 2023-07-15 DIAGNOSIS — M1611 Unilateral primary osteoarthritis, right hip: Secondary | ICD-10-CM | POA: Diagnosis not present

## 2023-07-15 LAB — GLUCOSE, CAPILLARY: Glucose-Capillary: 128 mg/dL — ABNORMAL HIGH (ref 70–99)

## 2023-07-15 MED ORDER — METOCLOPRAMIDE HCL 5 MG/ML IJ SOLN
INTRAMUSCULAR | Status: AC
  Filled 2023-07-15: qty 2

## 2023-07-15 MED ORDER — ASPIRIN 81 MG PO TABS: 81.0000 mg | ORAL_TABLET | Freq: Two times a day (BID) | ORAL | Status: AC

## 2023-07-15 MED ORDER — INSULIN ASPART 100 UNIT/ML IJ SOLN
INTRAMUSCULAR | Status: AC
  Filled 2023-07-15: qty 1

## 2023-07-15 MED ORDER — OXYCODONE HCL 5 MG PO TABS: 5.0000 mg | ORAL_TABLET | ORAL | 0 refills | Status: AC | PRN

## 2023-07-15 MED ORDER — ASPIRIN 81 MG PO CHEW
CHEWABLE_TABLET | ORAL | Status: AC
  Filled 2023-07-15: qty 1

## 2023-07-15 MED ORDER — METFORMIN HCL 500 MG PO TABS
ORAL_TABLET | ORAL | Status: AC
  Filled 2023-07-15: qty 2

## 2023-07-15 MED ORDER — CELECOXIB 200 MG PO CAPS: 200.0000 mg | ORAL_CAPSULE | Freq: Two times a day (BID) | ORAL | 1 refills | Status: AC

## 2023-07-15 MED ORDER — CELECOXIB 200 MG PO CAPS
ORAL_CAPSULE | ORAL | Status: AC
  Filled 2023-07-15: qty 1

## 2023-07-15 MED ORDER — PANTOPRAZOLE SODIUM 40 MG PO TBEC
DELAYED_RELEASE_TABLET | ORAL | Status: AC
  Filled 2023-07-15: qty 1

## 2023-07-15 MED ORDER — SENNOSIDES-DOCUSATE SODIUM 8.6-50 MG PO TABS
ORAL_TABLET | ORAL | Status: AC
  Filled 2023-07-15: qty 1

## 2023-07-15 MED ORDER — FERROUS SULFATE 325 (65 FE) MG PO TABS
ORAL_TABLET | ORAL | Status: AC
  Filled 2023-07-15: qty 1

## 2023-07-15 MED ORDER — LORATADINE 10 MG PO TABS
ORAL_TABLET | ORAL | Status: AC
  Filled 2023-07-15: qty 1

## 2023-07-15 MED ORDER — ACETAMINOPHEN 10 MG/ML IV SOLN
INTRAVENOUS | Status: AC
  Filled 2023-07-15: qty 100

## 2023-07-15 MED ORDER — GABAPENTIN 300 MG PO CAPS
ORAL_CAPSULE | ORAL | Status: AC
  Filled 2023-07-15: qty 1

## 2023-07-15 MED ORDER — TRAMADOL HCL 50 MG PO TABS
ORAL_TABLET | ORAL | Status: AC
  Filled 2023-07-15: qty 1

## 2023-07-15 MED ORDER — TRAMADOL HCL 50 MG PO TABS: 50.0000 mg | ORAL_TABLET | ORAL | 0 refills | Status: AC | PRN

## 2023-07-15 MED ORDER — METOCLOPRAMIDE HCL 10 MG PO TABS
ORAL_TABLET | ORAL | Status: AC
  Filled 2023-07-15: qty 1

## 2023-07-15 NOTE — Discharge Summary (Signed)
Physician Discharge Summary  Subjective: 1 Day Post-Op Procedure(s) (LRB): TOTAL HIP ARTHROPLASTY (Right) Patient reports pain as 4 on 0-10 scale.   Patient seen in rounds with Dr. Ernest Pine. Patient is well, and has had no acute complaints or problems.  Denies any CP, SOB, N/B, fevers or chills We will continue with therapy today Patient is ready to go home  Physician Discharge Summary  Patient ID: Amanda Watson MRN: [id number] DOB/AGE: Aug 26, 1951 72 y.o.  Admit date: 07/14/2023 Discharge date: 07/15/2023  Admission Diagnoses:  Discharge Diagnoses:  Principal Problem:   Hx of total hip arthroplasty, right   Discharged Condition: good  Hospital Course: Patient presented to the hospital on 07/14/2023 for an elective right total hip arthroplasty performed by Dr. Ernest Pine.  The patient was given 1 g of TXA and 2 g of Ancef perioperatively.  She tolerated the procedure well without any complications.  See operative details below.  Postoperatively, the patient did well.  Her JP drain was removed postop day 1 without any difficulty, intact.  She was able to pass her PT protocols postop day 1.  She has gone to the bathroom and voided her bladder.  She denies any CP, SOB, fevers, chills, N/V.  Her vital signs are stable.  Patient is stable for discharge  PROCEDURE:  Right total hip arthroplasty   SURGEON:  Jena Gauss. M.D.   ASSISTANT:  Gean Birchwood, PA-C (present and scrubbed throughout the case, critical for assistance with exposure, retraction, instrumentation, and closure)   ANESTHESIA: spinal   ESTIMATED BLOOD LOSS: 100 mL   FLUIDS REPLACED: 600 mL of crystalloid   DRAINS: 2 medium Hemovac drains   IMPLANTS UTILIZED: DePuy size 2 standard offset Actis femoral stem, 50 mm OD Pinnacle GRIPTION Sector acetabular component, 6.5 x 20 mm cancellous screw, +4 mm neutral Pinnacle Altrx polyethylene insert, and a 32 mm CoCr +1 mm hip ball  Treatments: None  Discharge Exam: Blood  pressure (!) 106/55, pulse 68, temperature 97.9 F (36.6 C), temperature source Temporal, resp. rate 16, height 5' (1.524 m), weight 101.2 kg, SpO2 95%.   Disposition: Home   Allergies as of 07/15/2023       Reactions   Levaquin [levofloxacin] Nausea And Vomiting        Medication List     TAKE these medications    acidophilus Caps capsule Take 1 capsule by mouth daily.   albuterol 108 (90 Base) MCG/ACT inhaler Commonly known as: VENTOLIN HFA Inhale 1 puff into the lungs every 6 (six) hours as needed for wheezing or shortness of breath.   aspirin 81 MG tablet Take 1 tablet (81 mg total) by mouth in the morning and at bedtime. What changed: when to take this   Azelastine HCl 137 MCG/SPRAY Soln Place 1 spray into both nostrils daily as needed (allergies).   Calcium 600 600 MG Tabs tablet Generic drug: calcium carbonate Take 600 mg by mouth daily with breakfast.   celecoxib 200 MG capsule Commonly known as: CELEBREX Take 1 capsule (200 mg total) by mouth 2 (two) times daily.   cholecalciferol 25 MCG (1000 UNIT) tablet Commonly known as: VITAMIN D3 Take 2,000 Units by mouth daily.   cyanocobalamin 1000 MCG tablet Commonly known as: VITAMIN B12 Take 500 mcg by mouth daily.   diphenhydrAMINE 25 MG tablet Commonly known as: BENADRYL Take 25 mg by mouth at bedtime as needed.   docusate sodium 100 MG capsule Commonly known as: COLACE Take 200 mg by mouth 2 (  two) times daily.   ferrous sulfate 324 (65 Fe) MG Tbec Take by mouth.   fexofenadine 180 MG tablet Commonly known as: ALLEGRA Take 180 mg by mouth daily.   Fluticasone-Salmeterol 250-50 MCG/DOSE Aepb Commonly known as: ADVAIR Inhale 1 puff into the lungs 2 (two) times daily as needed (shortness of breath).   gabapentin 300 MG capsule Commonly known as: NEURONTIN Take 300 mg by mouth 4 (four) times daily.   Magnesium 400 MG Tabs Take 250 mg by mouth daily.   metFORMIN 1000 MG tablet Commonly  known as: GLUCOPHAGE Take 1,000 mg by mouth 2 (two) times daily with a meal.   montelukast 10 MG tablet Commonly known as: SINGULAIR Take 10 mg by mouth at bedtime.   niacin 500 MG ER tablet Commonly known as: VITAMIN B3 Take 500 mg by mouth at bedtime.   omeprazole 20 MG capsule Commonly known as: PRILOSEC Take 20 mg by mouth daily.   oxyCODONE 5 MG immediate release tablet Commonly known as: Oxy IR/ROXICODONE Take 1 tablet (5 mg total) by mouth every 4 (four) hours as needed for moderate pain (pain score 4-6).   potassium gluconate 595 (99 K) MG Tabs tablet Take 99 mg by mouth daily.   simvastatin 40 MG tablet Commonly known as: ZOCOR Take 40 mg by mouth every evening.   telmisartan-hydrochlorothiazide 40-12.5 MG tablet Commonly known as: MICARDIS HCT Take 0.5 tablets by mouth daily.   traMADol 50 MG tablet Commonly known as: ULTRAM Take by mouth 3 (three) times daily as needed. What changed: Another medication with the same name was added. Make sure you understand how and when to take each.   traMADol 50 MG tablet Commonly known as: ULTRAM Take 1-2 tablets (50-100 mg total) by mouth every 4 (four) hours as needed for moderate pain. What changed: You were already taking a medication with the same name, and this prescription was added. Make sure you understand how and when to take each.               Durable Medical Equipment  (From admission, onward)           Start     Ordered   07/14/23 1643  DME Walker rolling  Once       Question:  Patient needs a walker to treat with the following condition  Answer:  S/P total hip arthroplasty   07/14/23 1642   07/14/23 1241  DME Bedside commode  Once       Comments: Patient is not able to walk the distance required to go the bathroom, or he/she is unable to safely negotiate stairs required to access the bathroom.  A 3in1 BSC will alleviate this problem  Question:  Patient needs a bedside commode to treat with the  following condition  Answer:  S/P total hip arthroplasty   07/14/23 1240            Follow-up Information     Hooten, Illene Labrador, MD Follow up on 08/26/2023.   Specialty: Orthopedic Surgery Why: at 3:00pm Contact information: [address] [facility] [place] [address] [phone]                 Signed: Gean Birchwood 07/15/2023, 7:59 AM   Objective: Vital signs in last 24 hours: Temp:  [97.3 F (36.3 C)-98.2 F (36.8 C)] 97.9 F (36.6 C) (10/10 0636) Pulse Rate:  [59-86] 68 (10/10 0636) Resp:  [9-26] 16 (10/10 0636) BP: (102-141)/(53-73) 106/55 (10/10  0636) SpO2:  [87 %-100 %] 95 % (10/10 0636)  Intake/Output from previous day:  Intake/Output Summary (Last 24 hours) at 07/15/2023 0759 Last data filed at 07/15/2023 0700 Gross per 24 hour  Intake 700 ml  Output 620 ml  Net 80 ml    Intake/Output this shift: No intake/output data recorded.  Labs: No results for input(s): "HGB" in the last 72 hours. No results for input(s): "WBC", "RBC", "HCT", "PLT" in the last 72 hours. No results for input(s): "NA", "K", "CL", "CO2", "BUN", "CREATININE", "GLUCOSE", "CALCIUM" in the last 72 hours. No results for input(s): "LABPT", "INR" in the last 72 hours.  EXAM: General - Patient is Alert, Appropriate, and Oriented Extremity - Neurologically intact ABD soft Neurovascular intact Sensation intact distally Intact pulses distally Dorsiflexion/Plantar flexion intact No cellulitis present Compartment soft Dressing - dressing C/D/I and no drainage Motor Function - intact, moving foot and toes well on exam.  Able to plantar flex and dorsiflex with good strength and range of motion.  Has some mild difficulty with SLR, but is able to do so with help from provider.  Is neurovascularly intact all dermatomes on her right lower extremity.  Posterior tibial pulses appreciated, 2+. JP Drain pulled without difficulty. Intact  Assessment/Plan: 1 Day  Post-Op Procedure(s) (LRB): TOTAL HIP ARTHROPLASTY (Right) Procedure(s) (LRB): TOTAL HIP ARTHROPLASTY (Right) Past Medical History:  Diagnosis Date   Anemia    Arthritis    Asthma    Cancer (HCC) 1999   cervical ca   Diabetes mellitus without complication (HCC)    GERD (gastroesophageal reflux disease)    History of Bell's palsy    Hypercholesteremia    Hyperlipidemia    Hypertension    Multinodular goiter    Psoriasis    Sleep apnea    uses CPAP   Spinal stenosis    Principal Problem:   Hx of total hip arthroplasty, right  Estimated body mass index is 43.55 kg/m as calculated from the following:   Height as of this encounter: 5' (1.524 m).   Weight as of this encounter: 101.2 kg.  Patient has passed all of her PT protocols and is stable for discharge home.  She will work with home health physical therapy on gait, strength and ROM of this right lower extremity.  TOC has been contacted about setting up home health physical therapy   Hip Preacutions   Discussed with the patient continuing to utilize ice over the bandage   Patient will wear TED hose bilaterally to help prevent DVT and clot formation   Discussed the Aquacel bandage.  This bandage will stay in place 7 days postoperatively.  Can be replaced with honeycomb bandages that will be sent home with the patient   Discussed sending the patient home with tramadol and oxycodone for as needed pain management.  Patient will also be sent home with Celebrex to help with swelling and inflammation.  Patient will take an 81 mg aspirin twice daily for DVT prophylaxis   JP drain removed without difficulty, intact   Weight-Bearing as tolerated to right leg   Patient will follow up with [facility] clinic orthopedics in 6 weeks for reimaging and reevaluation.  Diet - Regular diet Follow up - in 2 weeks Activity - WBAT Disposition - Home Condition Upon Discharge - Good DVT Prophylaxis - Aspirin and TED hose  Danise Edge, PA-C Orthopaedic Surgery 07/15/2023, 7:59 AM

## 2023-07-15 NOTE — TOC Progression Note (Signed)
Transition of Care [facility]) - Progression Note    Patient Details  Name: Amanda Watson MRN: [id number] Date of Birth: 28-Jul-1951  [facility] CM/SW Contact  Marlowe Sax, RN Phone Number: 07/15/2023, 8:52 AM  Clinical Narrative:     The patient is set up with [facility] for [facility]  3 in 1 delivered to the bedside by [employer]  Expected Discharge Plan: Home w Home Health Services Barriers to Discharge: No Barriers Identified  Expected Discharge Plan and Services   Discharge Planning Services: CM Consult   Living arrangements for the past 2 months: Single Family Home Expected Discharge Date: 07/15/23               DME Arranged: 3-N-1 DME Agency: [employer]       HH Arranged: PT HH Agency: [facility] Date [facility] Agency Contacted: 07/15/23 Time HH Agency Contacted: [id number] Representative spoke with at [facility] Agency: Kandee Keen   Social Determinants of Health (SDOH) Interventions SDOH Screenings   Food Insecurity: No Food Insecurity (07/14/2023)  Housing: Low Risk  (07/14/2023)  Transportation Needs: No Transportation Needs (07/14/2023)  Utilities: Not At Risk (07/14/2023)  Financial Resource Strain: Patient Declined (10/23/2022)   Received from [facility], [facility]  Tobacco Use: Medium Risk (07/14/2023)    Readmission Risk Interventions     No data to display

## 2023-07-15 NOTE — Progress Notes (Signed)
Physical Therapy Treatment Patient Details Name: Amanda Watson MRN: [id number] DOB: 12/11/1950 Today's Date: 07/15/2023   History of Present Illness Pt is a 72 y/o F admitted on 07/14/23 for scheduled R THA. PMH: asthma, DM2, HTN, hypercholesterolemia, psoriasis, sleep apnea on CPAP, spinal stenosis    PT Comments  Pt is received sitting EOB with husband Charlie at bedside, she is agreeable to PT session. Pt endorses slight R hip discomfort but does not report pain at the beginning of session. Pt performs transfers, amb, and stairs CGA for safety. Pt able to amb approx 200 ft using RW with cuing for AD management to optimize use (gait impairments listed below). Additionally, Pt able to negotiate 4 steps using B railing for support and requiring demonstration and cuing to perform task with proper technique. Pt requires standing breaks throughout sessions due to R hip discomfort and reports taking standing breaks as PLOF. Pt reports 4/10 NPS R hip pain at end of session, describing pain more as constant pressure and soreness. Educated and reviewed post approach THA precautions with Pt-Pt verbalized understanding. Pt would benefit from cont skilled PT to address above deficits and promote optimal return to PLOF.   If plan is discharge home, recommend the following: A little help with walking and/or transfers;A little help with bathing/dressing/bathroom;Assistance with cooking/housework;Assist for transportation;Help with stairs or ramp for entrance   Can travel by private vehicle        Equipment Recommendations  BSC/3in1    Recommendations for Other Services       Precautions / Restrictions Precautions Precautions: Fall;Posterior Hip Precaution Booklet Issued: Yes (comment) Restrictions Weight Bearing Restrictions: Yes RLE Weight Bearing: Weight bearing as tolerated     Mobility  Bed Mobility               General bed mobility comments: Deferred due to  Pt seated EOB at beginning  of session    Transfers Overall transfer level: Needs assistance Equipment used: Rolling walker (2 wheels) Transfers: Sit to/from Stand Sit to Stand: Contact guard assist           General transfer comment: able to perform STS CGA with cuing for hand placement to facilitate task    Ambulation/Gait Ambulation/Gait assistance: Contact guard assist Gait Distance (Feet): 200 Feet Assistive device: Rolling walker (2 wheels) Gait Pattern/deviations: Step-to pattern, Decreased step length - right, Decreased step length - left, Decreased stride length, Antalgic Gait velocity: decreased     General Gait Details: Able to amb approx 200 ft using RW requiring breaks every ~50 ft due to discomfort   Stairs Stairs: Yes Stairs assistance: Contact guard assist Stair Management: Two rails, Step to pattern Number of Stairs: 4 General stair comments: able to navigate 4 steps using B railings for assist; demonstration requires prior to activity   Wheelchair Mobility     Tilt Bed    Modified Rankin (Stroke Patients Only)       Balance Overall balance assessment: Needs assistance Sitting-balance support: Feet supported Sitting balance-Leahy Scale: Good Sitting balance - Comments: able to maintain seated EOB balance during functional activities   Standing balance support: Bilateral upper extremity supported, During functional activity, Reliant on assistive device for balance Standing balance-Leahy Scale: Good Standing balance comment: able to maintain static standing balance during functional activities                            Cognition Arousal: Alert Behavior During Therapy: [facility]  for tasks assessed/performed Overall Cognitive Status: Within Functional Limits for tasks assessed                                 General Comments: AO x4; pleasant and cooperative with PT        Exercises Other Exercises Other Exercises: Educated Pt on THA  precautions    General Comments General comments (skin integrity, edema, etc.): surgical site assessed pre/post session and appears good.      Pertinent Vitals/Pain Pain Assessment Pain Assessment: 0-10 Pain Score: 4  Pain Location: R hip Pain Descriptors / Indicators: Discomfort, Guarding, Sore, Operative site guarding Pain Intervention(s): Monitored during session, Repositioned    Home Living                          Prior Function            PT Goals (current goals can now be found in the care plan section) Acute Rehab PT Goals Patient Stated Goal: get better PT Goal Formulation: With patient Time For Goal Achievement: 07/28/23 Potential to Achieve Goals: Good Progress towards PT goals: Progressing toward goals    Frequency    BID      PT Plan      Co-evaluation              AM-PAC PT "6 Clicks" Mobility   Outcome Measure  Help needed turning from your back to your side while in a flat bed without using bedrails?: A Little Help needed moving from lying on your back to sitting on the side of a flat bed without using bedrails?: A Little Help needed moving to and from a bed to a chair (including a wheelchair)?: A Little Help needed standing up from a chair using your arms (e.g., wheelchair or bedside chair)?: A Little Help needed to walk in hospital room?: A Little Help needed climbing 3-5 steps with a railing? : A Little 6 Click Score: 18    End of Session Equipment Utilized During Treatment: Gait belt Activity Tolerance: Patient tolerated treatment well;Patient limited by fatigue Patient left: in chair;with call bell/phone within reach;with family/visitor present Nurse Communication: Mobility status PT Visit Diagnosis: Pain;Muscle weakness (generalized) (M62.81);Other abnormalities of gait and mobility (R26.89);Difficulty in walking, not elsewhere classified (R26.2);Unsteadiness on feet (R26.81) Pain - Right/Left: Right Pain - part of body:  Hip     Time: 0912-0936 PT Time Calculation (min) (ACUTE ONLY): 24 min  Charges:                            Elmon Else, SPT    Zakariya Knickerbocker 07/15/2023, 9:47 AM

## 2023-07-15 NOTE — Plan of Care (Signed)
  Problem: Activity: Goal: Ability to avoid complications of mobility impairment will improve Outcome: Progressing   Problem: Pain Management: Goal: Pain level will decrease with appropriate interventions Outcome: Progressing   

## 2023-07-15 NOTE — Addendum Note (Signed)
Addendum  created 07/15/23 1353 by Stormy Fabian, CRNA   Flowsheet accepted

## 2023-07-15 NOTE — Evaluation (Signed)
Occupational Therapy Evaluation Patient Details Name: Amanda Watson MRN: [id number] DOB: [birth year] Today's Date: 07/15/2023   History of Present Illness Pt is a 72 y/o F admitted on 07/14/23 for scheduled R THA. PMH: asthma, DM2, HTN, hypercholesterolemia, psoriasis, sleep apnea on CPAP, spinal stenosis   Clinical Impression   Amanda Watson was seen for OT evaluation this date. Prior to hospital admission, pt was MOD I using SPC. Pt lives with spouse. Pt currently requires SUPERVISION don shorts using reacher, assist for standing portion no AD use. SUPERVISION + RW for toilet t/f and pericare standing. Pt educated on functional application of posterior hip pcns, AE recs, compression stocking mgmt, and falls prevention. All education complete, will sign off. Upon hospital discharge, recommend no OT follow up.     If plan is discharge home, recommend the following: Help with stairs or ramp for entrance    Functional Status Assessment  Patient has had a recent decline in their functional status and demonstrates the ability to make significant improvements in function in a reasonable and predictable amount of time.  Equipment Recommendations  Other (comment) Lexicographer)    Recommendations for Other Services       Precautions / Restrictions Precautions Precautions: Fall;Posterior Hip Precaution Booklet Issued: Yes (comment) Restrictions Weight Bearing Restrictions: Yes RLE Weight Bearing: Weight bearing as tolerated      Mobility Bed Mobility               General bed mobility comments: not tested    Transfers Overall transfer level: Needs assistance Equipment used: None Transfers: Sit to/from Stand Sit to Stand: Supervision                  Balance Overall balance assessment: Needs assistance Sitting-balance support: Feet supported Sitting balance-Leahy Scale: Good     Standing balance support: No upper extremity supported, During functional activity Standing  balance-Leahy Scale: Good                             ADL either performed or assessed with clinical judgement   ADL Overall ADL's : Needs assistance/impaired                                       General ADL Comments: MOD I don shorts using reacher. SUPERVISION + RW for toilet t/f and pericare standing      Pertinent Vitals/Pain Pain Assessment Pain Assessment: 0-10 Pain Score: 4  Pain Location: R hip Pain Descriptors / Indicators: Discomfort, Guarding, Sore, Operative site guarding Pain Intervention(s): Limited activity within patient's tolerance, Repositioned     Extremity/Trunk Assessment Upper Extremity Assessment Upper Extremity Assessment: Overall WFL for tasks assessed   Lower Extremity Assessment Lower Extremity Assessment: Generalized weakness       Communication Communication Communication: No apparent difficulties Cueing Techniques: Verbal cues   Cognition Arousal: Alert Behavior During Therapy: WFL for tasks assessed/performed Overall Cognitive Status: Within Functional Limits for tasks assessed                                 General Comments: recalls 2/3 posterior hip pcns      Home Living Family/patient expects to be discharged to:: Private residence Living Arrangements: Spouse/significant other Available Help at Discharge: Family Type of Home: Mobile home Home Access: Ramped  entrance     Home Layout: One level               Home Equipment: Agricultural consultant (2 wheels);Cane - single point          Prior Functioning/Environment Prior Level of Function : Driving;Independent/Modified Independent             Mobility Comments: Ambulatory with SPC, denies falls. ADLs Comments: Assistance with donning socks 2/2 decreased ROM.        OT Problem List: Decreased range of motion         OT Goals(Current goals can be found in the care plan section) Acute Rehab OT Goals Patient Stated Goal:  to go home OT Goal Formulation: With patient/family Time For Goal Achievement: 07/15/23 Potential to Achieve Goals: Good   AM-PAC OT "6 Clicks" Daily Activity     Outcome Measure Help from another person eating meals?: None Help from another person taking care of personal grooming?: None Help from another person toileting, which includes using toliet, bedpan, or urinal?: A Little Help from another person bathing (including washing, rinsing, drying)?: A Little Help from another person to put on and taking off regular upper body clothing?: None Help from another person to put on and taking off regular lower body clothing?: A Little 6 Click Score: 21   End of Session Equipment Utilized During Treatment: Rolling walker (2 wheels) Nurse Communication: Mobility status  Activity Tolerance: Patient tolerated treatment well Patient left: in chair;with call bell/phone within reach;with family/visitor present  OT Visit Diagnosis: Unsteadiness on feet (R26.81)                Time: 8469-6295 OT Time Calculation (min): 17 min Charges:  OT General Charges $OT Visit: 1 Visit OT Evaluation $OT Eval Low Complexity: 1 Low OT Treatments $Self Care/Home Management : 8-22 mins  Kathie Dike, M.S. OTR/L  07/15/23, 10:13 AM  ascom [phone]

## 2023-07-15 NOTE — Progress Notes (Signed)
Subjective: 1 Day Post-Op Procedure(s) (LRB): TOTAL HIP ARTHROPLASTY (Right) Patient reports pain as 4 on 0-10 scale.   Patient seen in rounds with Dr. Ernest Pine. Patient is well, and has had no acute complaints or problems.  Denies any CP, SOB, N/B, fevers or chills We will continue with therapy today.  Plan is to go Home after hospital stay.  Objective: Vital signs in last 24 hours: Temp:  [97.3 F (36.3 C)-98.2 F (36.8 C)] 97.9 F (36.6 C) (10/10 0636) Pulse Rate:  [59-86] 68 (10/10 0636) Resp:  [9-26] 16 (10/10 0636) BP: (102-141)/(53-73) 106/55 (10/10 0636) SpO2:  [87 %-100 %] 95 % (10/10 0636)  Intake/Output from previous day:  Intake/Output Summary (Last 24 hours) at 07/15/2023 0756 Last data filed at 07/15/2023 0700 Gross per 24 hour  Intake 700 ml  Output 620 ml  Net 80 ml    Intake/Output this shift: No intake/output data recorded.  Labs: No results for input(s): "HGB" in the last 72 hours. No results for input(s): "WBC", "RBC", "HCT", "PLT" in the last 72 hours. No results for input(s): "NA", "K", "CL", "CO2", "BUN", "CREATININE", "GLUCOSE", "CALCIUM" in the last 72 hours. No results for input(s): "LABPT", "INR" in the last 72 hours.  EXAM General - Patient is Alert, Appropriate, and Oriented Extremity - Neurologically intact ABD soft Neurovascular intact Sensation intact distally Intact pulses distally Dorsiflexion/Plantar flexion intact No cellulitis present Compartment soft Dressing - dressing C/D/I and no drainage Motor Function - intact, moving foot and toes well on exam.  Able to plantar flex and dorsiflex with good strength and range of motion.  Has some mild difficulty with SLR, but is able to do so with help from provider.  Is neurovascularly intact all dermatomes on her right lower extremity.  Posterior tibial pulses appreciated, 2+. JP Drain pulled without difficulty. Intact  Past Medical History:  Diagnosis Date   Anemia    Arthritis     Asthma    Cancer (HCC) 1999   cervical ca   Diabetes mellitus without complication (HCC)    GERD (gastroesophageal reflux disease)    History of Bell's palsy    Hypercholesteremia    Hyperlipidemia    Hypertension    Multinodular goiter    Psoriasis    Sleep apnea    uses CPAP   Spinal stenosis     Assessment/Plan: 1 Day Post-Op Procedure(s) (LRB): TOTAL HIP ARTHROPLASTY (Right) Principal Problem:   Hx of total hip arthroplasty, right  Estimated body mass index is 43.55 kg/m as calculated from the following:   Height as of this encounter: 5' (1.524 m).   Weight as of this encounter: 101.2 kg. Advance diet Up with therapy  Patient will continue to work with physical therapy to pass postoperative PT protocols, ROM and strengthening  Hip Preacutions  Discussed with the patient continuing to utilize ice over the bandage  Patient will wear TED hose bilaterally to help prevent DVT and clot formation  Discussed the Aquacel bandage.  This bandage will stay in place 7 days postoperatively.  Can be replaced with honeycomb bandages that will be sent home with the patient  Discussed sending the patient home with tramadol and oxycodone for as needed pain management.  Patient will also be sent home with Celebrex to help with swelling and inflammation.  Patient will take an 81 mg aspirin twice daily for DVT prophylaxis  JP drain removed without difficulty, intact  Weight-Bearing as tolerated to right leg  Patient will [facility] in 6 weeks for re-imaging and reevaluation   Rayburn Go, PA-C [facility] 07/15/2023, 7:56 AM

## 2023-07-15 NOTE — Care Management Obs Status (Signed)
MEDICARE OBSERVATION STATUS NOTIFICATION   Patient Details  Name: MALEIGHA COLVARD MRN: [id number] Date of Birth: Jan 18, 1951   Medicare Observation Status Notification Given:  Yes    Marlowe Sax, RN 07/15/2023, 8:55 AM

## 2023-07-15 NOTE — Plan of Care (Signed)
  Problem: Metabolic: Goal: Ability to maintain appropriate glucose levels will improve Outcome: Progressing   Problem: Activity: Goal: Ability to avoid complications of mobility impairment will improve Outcome: Progressing   Problem: Pain Management: Goal: Pain level will decrease with appropriate interventions Outcome: Progressing

## 2023-07-15 NOTE — Progress Notes (Signed)
DISCHARGE NOTE:  Pt given discharge instructions and verbalized understanding. TED hose on both legs. [facility] sent with pt. Pt wheeled to car by staff. Husband providing transportation.

## 2023-07-15 NOTE — Anesthesia Postprocedure Evaluation (Signed)
Anesthesia Post Note  Patient: Amanda Watson  Procedure(s) Performed: TOTAL HIP ARTHROPLASTY (Right: Hip)  Patient location during evaluation: PACU Anesthesia Type: Spinal Level of consciousness: awake and alert Pain management: pain level controlled Vital Signs Assessment: post-procedure vital signs reviewed and stable Respiratory status: spontaneous breathing, nonlabored ventilation, respiratory function stable and patient connected to nasal cannula oxygen Cardiovascular status: blood pressure returned to baseline and stable Postop Assessment: no apparent nausea or vomiting Anesthetic complications: no   No notable events documented.   Last Vitals:  Vitals:   07/15/23 0100 07/15/23 0636  BP: (!) 130/59 (!) 106/55  Pulse: 75 68  Resp: 16 16  Temp: 36.8 C 36.6 C  SpO2: 95% 95%    Last Pain:  Vitals:   07/15/23 0636  TempSrc: Temporal  PainSc:                  [name]

## 2023-07-16 LAB — SURGICAL PATHOLOGY

## 2023-07-22 ENCOUNTER — Encounter: Payer: Self-pay | Admitting: Orthopedic Surgery

## 2023-07-29 NOTE — Plan of Care (Signed)
[facility] Tonsillectomy/Adenoidectomy, Postoperative PEDS care plan entered in error.

## 2024-01-21 ENCOUNTER — Other Ambulatory Visit: Payer: Self-pay | Admitting: Family Medicine

## 2024-01-21 DIAGNOSIS — Z1231 Encounter for screening mammogram for malignant neoplasm of breast: Secondary | ICD-10-CM

## 2024-02-10 ENCOUNTER — Ambulatory Visit
Admission: RE | Admit: 2024-02-10 | Discharge: 2024-02-10 | Disposition: A | Discharge status: Home / Self Care | Source: Ambulatory Visit | Attending: Family Medicine | Admitting: Family Medicine

## 2024-02-10 DIAGNOSIS — Z1231 Encounter for screening mammogram for malignant neoplasm of breast: Secondary | ICD-10-CM | POA: Diagnosis present
